# Patient Record
Sex: Male | Born: 1950 | Race: White | Hispanic: No | Marital: Married | Smoking: Former smoker
Health system: Southern US, Community
[De-identification: ages and names within clinical notes are randomized; demographics above are authoritative.]

## PROBLEM LIST (undated history)

## (undated) DIAGNOSIS — F329 Major depressive disorder, single episode, unspecified: Secondary | ICD-10-CM

## (undated) DIAGNOSIS — F1010 Alcohol abuse, uncomplicated: Secondary | ICD-10-CM

## (undated) DIAGNOSIS — N179 Acute kidney failure, unspecified: Secondary | ICD-10-CM

## (undated) DIAGNOSIS — F32A Depression, unspecified: Secondary | ICD-10-CM

## (undated) DIAGNOSIS — E871 Hypo-osmolality and hyponatremia: Secondary | ICD-10-CM

## (undated) DIAGNOSIS — R079 Chest pain, unspecified: Secondary | ICD-10-CM

## (undated) DIAGNOSIS — I10 Essential (primary) hypertension: Secondary | ICD-10-CM

## (undated) DIAGNOSIS — E785 Hyperlipidemia, unspecified: Secondary | ICD-10-CM

## (undated) DIAGNOSIS — R748 Abnormal levels of other serum enzymes: Secondary | ICD-10-CM

## (undated) HISTORY — PX: BACK SURGERY: SHX140

## (undated) HISTORY — DX: Hyperlipidemia, unspecified: E78.5

## (undated) HISTORY — PX: HERNIA REPAIR: SHX51

## (undated) HISTORY — PX: LITHOTRIPSY: SUR834

---

## 1998-12-18 ENCOUNTER — Encounter: Admission: RE | Admit: 1998-12-18 | Discharge: 1998-12-18 | Payer: Self-pay | Admitting: Urology

## 1999-06-02 ENCOUNTER — Ambulatory Visit (HOSPITAL_COMMUNITY): Admission: RE | Admit: 1999-06-02 | Discharge: 1999-06-02 | Payer: Self-pay | Admitting: Urology

## 1999-06-02 ENCOUNTER — Encounter (INDEPENDENT_AMBULATORY_CARE_PROVIDER_SITE_OTHER): Payer: Self-pay

## 2000-02-22 ENCOUNTER — Ambulatory Visit (HOSPITAL_COMMUNITY): Admission: RE | Admit: 2000-02-22 | Discharge: 2000-02-22 | Payer: Self-pay | Admitting: Family Medicine

## 2000-02-22 ENCOUNTER — Encounter: Payer: Self-pay | Admitting: Family Medicine

## 2000-03-26 ENCOUNTER — Emergency Department (HOSPITAL_COMMUNITY): Admission: EM | Admit: 2000-03-26 | Discharge: 2000-03-26 | Payer: Self-pay | Admitting: Emergency Medicine

## 2000-03-26 ENCOUNTER — Encounter: Payer: Self-pay | Admitting: Emergency Medicine

## 2000-04-05 ENCOUNTER — Ambulatory Visit (HOSPITAL_COMMUNITY): Admission: RE | Admit: 2000-04-05 | Discharge: 2000-04-05 | Payer: Self-pay | Admitting: Family Medicine

## 2000-04-05 ENCOUNTER — Encounter: Payer: Self-pay | Admitting: Family Medicine

## 2000-04-07 ENCOUNTER — Encounter: Payer: Self-pay | Admitting: Urology

## 2000-04-07 ENCOUNTER — Ambulatory Visit (HOSPITAL_COMMUNITY): Admission: RE | Admit: 2000-04-07 | Discharge: 2000-04-07 | Payer: Self-pay | Admitting: Urology

## 2000-04-12 ENCOUNTER — Encounter: Payer: Self-pay | Admitting: Urology

## 2000-04-12 ENCOUNTER — Encounter: Admission: RE | Admit: 2000-04-12 | Discharge: 2000-04-12 | Payer: Self-pay | Admitting: Urology

## 2000-04-20 ENCOUNTER — Ambulatory Visit (HOSPITAL_COMMUNITY): Admission: RE | Admit: 2000-04-20 | Discharge: 2000-04-20 | Payer: Self-pay | Admitting: Family Medicine

## 2000-04-20 ENCOUNTER — Encounter: Payer: Self-pay | Admitting: Family Medicine

## 2000-08-19 ENCOUNTER — Ambulatory Visit (HOSPITAL_COMMUNITY): Admission: RE | Admit: 2000-08-19 | Discharge: 2000-08-19 | Payer: Self-pay | Admitting: Surgery

## 2001-07-11 ENCOUNTER — Ambulatory Visit (HOSPITAL_COMMUNITY): Admission: RE | Admit: 2001-07-11 | Discharge: 2001-07-11 | Payer: Self-pay | Admitting: Family Medicine

## 2001-07-11 ENCOUNTER — Encounter: Payer: Self-pay | Admitting: Family Medicine

## 2001-07-27 ENCOUNTER — Inpatient Hospital Stay (HOSPITAL_COMMUNITY): Admission: RE | Admit: 2001-07-27 | Discharge: 2001-07-28 | Payer: Self-pay | Admitting: Neurosurgery

## 2001-07-27 ENCOUNTER — Encounter: Payer: Self-pay | Admitting: Neurosurgery

## 2001-10-01 ENCOUNTER — Encounter: Payer: Self-pay | Admitting: Family Medicine

## 2001-10-01 ENCOUNTER — Ambulatory Visit (HOSPITAL_COMMUNITY): Admission: RE | Admit: 2001-10-01 | Discharge: 2001-10-01 | Payer: Self-pay | Admitting: Family Medicine

## 2002-01-02 ENCOUNTER — Encounter: Payer: Self-pay | Admitting: Family Medicine

## 2002-01-02 ENCOUNTER — Encounter: Admission: RE | Admit: 2002-01-02 | Discharge: 2002-01-02 | Payer: Self-pay | Admitting: Family Medicine

## 2002-03-09 ENCOUNTER — Encounter: Admission: RE | Admit: 2002-03-09 | Discharge: 2002-03-09 | Payer: Self-pay | Admitting: Neurosurgery

## 2002-03-09 ENCOUNTER — Encounter: Payer: Self-pay | Admitting: Neurosurgery

## 2003-07-08 ENCOUNTER — Encounter: Admission: RE | Admit: 2003-07-08 | Discharge: 2003-07-08 | Payer: Self-pay | Admitting: Family Medicine

## 2003-08-02 ENCOUNTER — Encounter: Admission: RE | Admit: 2003-08-02 | Discharge: 2003-08-02 | Payer: Self-pay | Admitting: Family Medicine

## 2004-03-17 ENCOUNTER — Ambulatory Visit (HOSPITAL_COMMUNITY): Admission: RE | Admit: 2004-03-17 | Discharge: 2004-03-17 | Payer: Self-pay | Admitting: Family Medicine

## 2004-10-07 ENCOUNTER — Ambulatory Visit (HOSPITAL_COMMUNITY): Admission: RE | Admit: 2004-10-07 | Discharge: 2004-10-08 | Payer: Self-pay | Admitting: Neurological Surgery

## 2004-11-13 IMAGING — US US SCROTUM
1 series · 13 of 25 positions shown · non-contrast
Comparison: none

***CORRECTED REPORT – ADDITIONAL CHARGE ADDED FOR ARTERIAL/VENOUS FLOW IMAGES***
CLINICAL DATA: Right scrotal pain for three months. 
 SCROTAL ULTRASOUND
 Bilateral internal testicular blood flow is normal with no evidence for torsion.  The bilateral testes are normal in size with the right measuring 3.9 cm long x 2.1 cm AP x 2.8 cm wide and the left measuring 4 cm long x 2.3 cm AP x 2.6 cm wide.  At the right epididymal head are two benign-appearing cysts each measuring up to 4 mm consistent with incidental spermatoceles.  Also an additional cystic projection from the right epididymal head measuring up to 3 mm is consistent with anatomic variant appendix epididymis.  The left epididymal head shows a 5 mm similar benign cyst/spermatocele.   The bilateral epididymi are otherwise sonographically normal without inflammation.  Evidence for small left hydrocele is seen especially inferiorly within benign appearing septation.  The fluid at the right scrotum is upper limits of normal especially superiorly with no significant hydrocele.  A left varicocele is seen with Valsalva maneuver and left vessels measuring up to 4 mm wide. 
 IMPRESSION
 1.  Sonographically normal testes. 
 2.  Bilateral small benign appearing epididymal spermatoceles and visualization of the right appendix epididymis.  
 3.  Small left hydrocele as described. 
 4.  Left varicocele.  
 5.  Otherwise no significant abnormality.
  ***CORRECTED REPORT – ADDITIONAL CHARGE ADDED FOR ARTERIAL/VENOUS FLOW IMAGES***

[Series 1: unknown · 0.07mm/px · 13 of 65 slices shown]
[im 1/65]
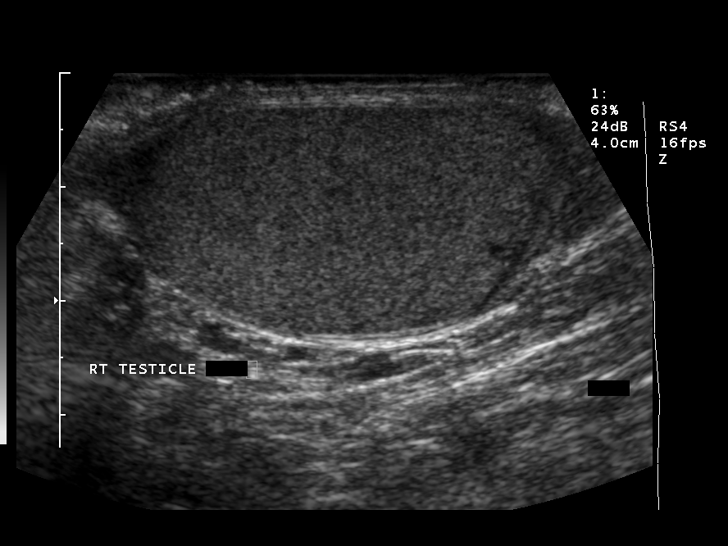
[im 6/65]
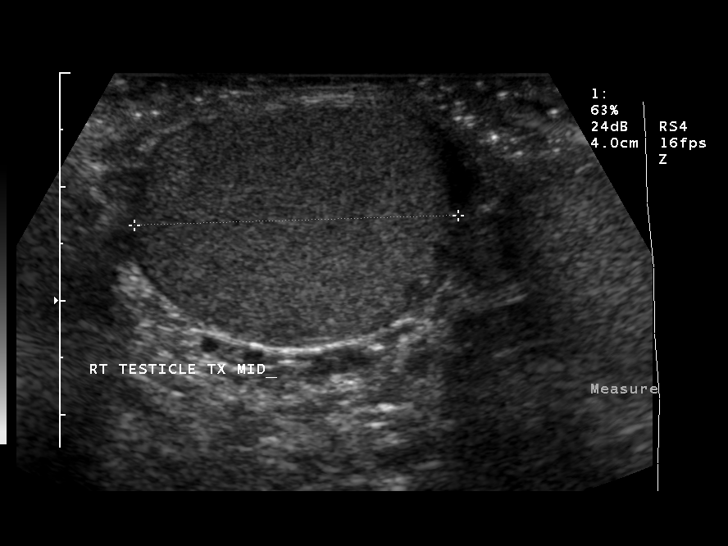
[im 11/65]
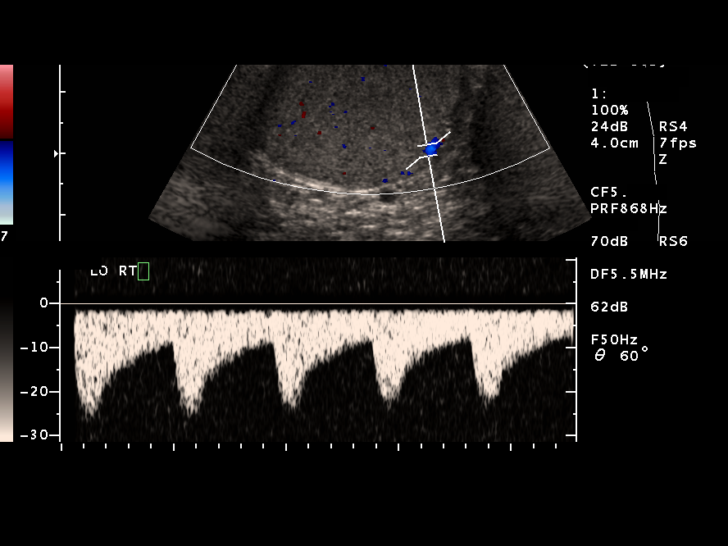
[im 17/65]
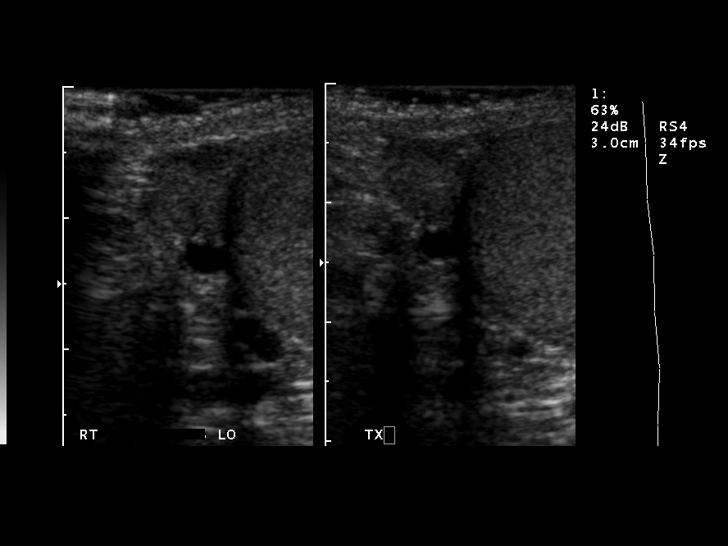
[im 22/65]
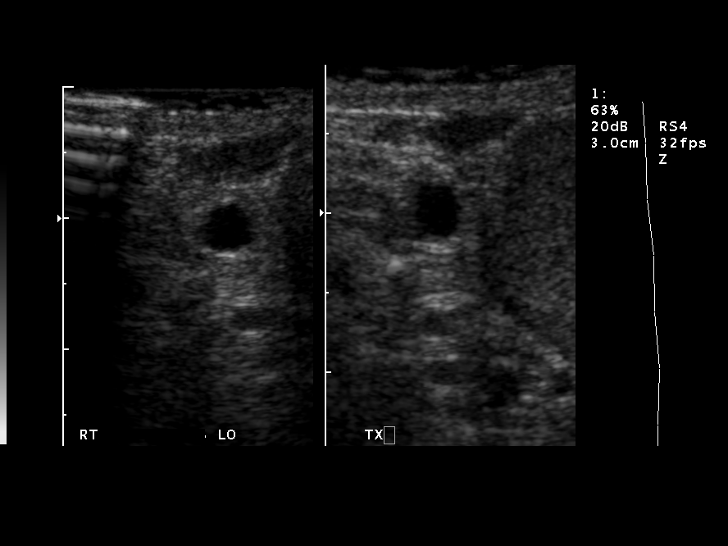
[im 27/65]
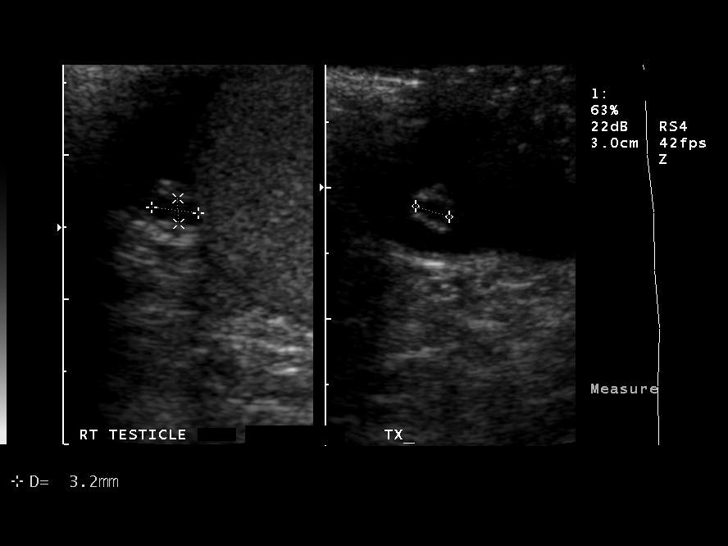
[im 33/65]
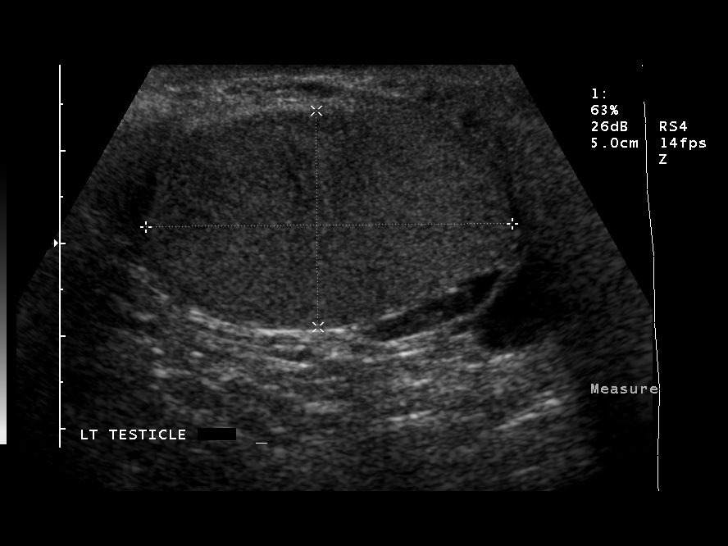
[im 38/65]
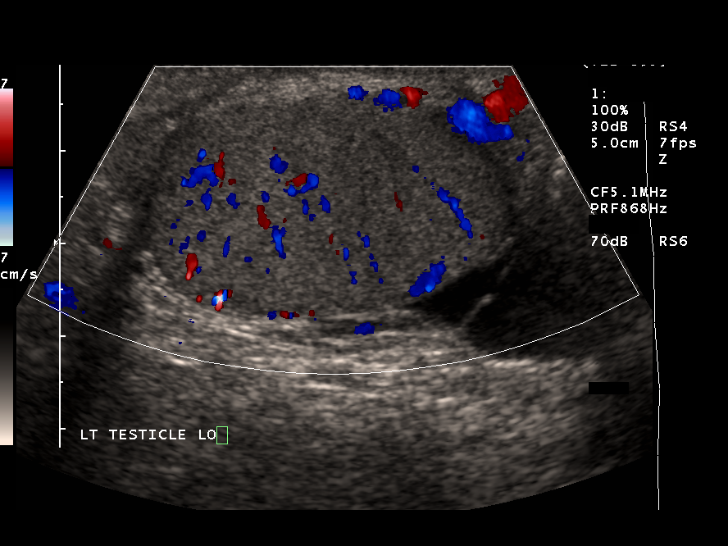
[im 43/65]
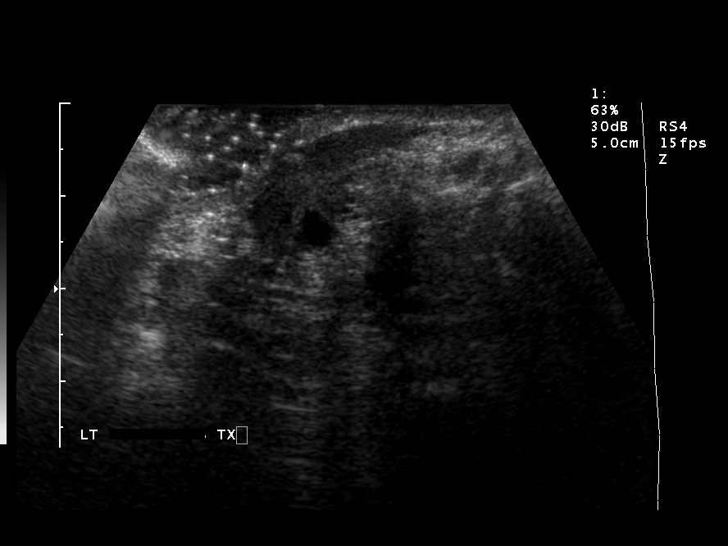
[im 49/65]
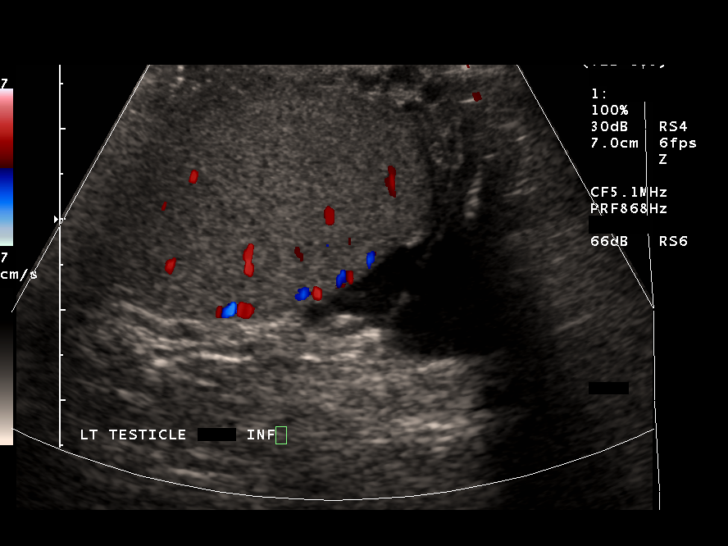
[im 54/65]
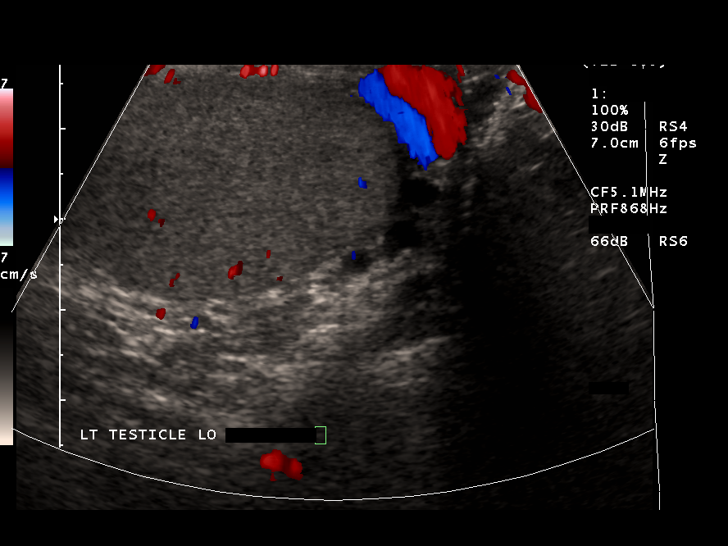
[im 59/65]
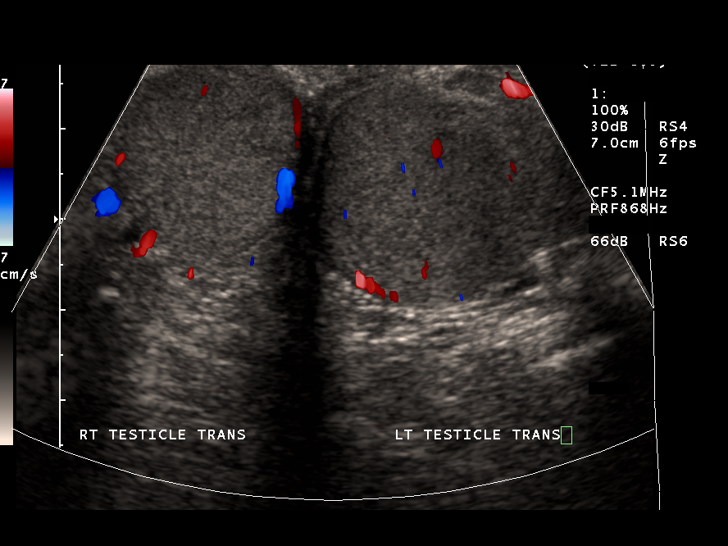
[im 65/65]
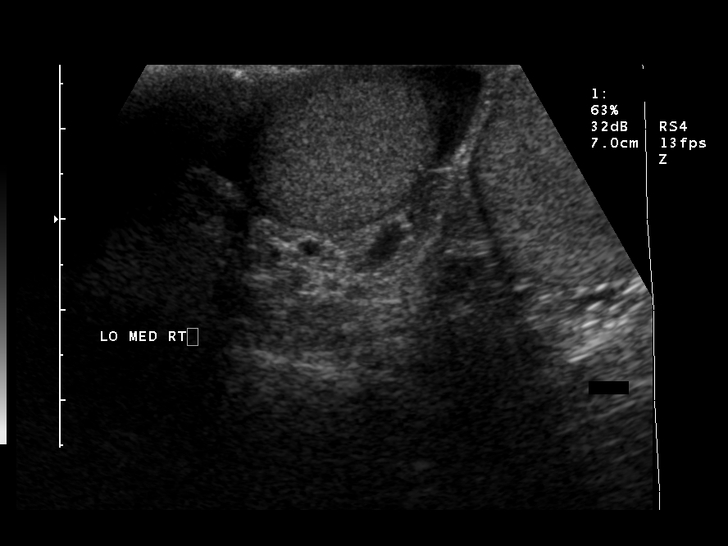

[13 of 25 positions shown; findings below may reference images not displayed]

## 2005-05-10 ENCOUNTER — Encounter: Admission: RE | Admit: 2005-05-10 | Discharge: 2005-05-10 | Payer: Self-pay | Admitting: Neurological Surgery

## 2005-12-17 ENCOUNTER — Encounter: Admission: RE | Admit: 2005-12-17 | Discharge: 2005-12-17 | Payer: Self-pay | Admitting: Neurological Surgery

## 2006-02-23 ENCOUNTER — Inpatient Hospital Stay (HOSPITAL_COMMUNITY): Admission: RE | Admit: 2006-02-23 | Discharge: 2006-02-24 | Payer: Self-pay | Admitting: Neurological Surgery

## 2006-03-28 ENCOUNTER — Encounter: Admission: RE | Admit: 2006-03-28 | Discharge: 2006-03-28 | Payer: Self-pay | Admitting: Neurological Surgery

## 2006-06-28 ENCOUNTER — Encounter: Admission: RE | Admit: 2006-06-28 | Discharge: 2006-06-28 | Payer: Self-pay | Admitting: Neurological Surgery

## 2006-09-26 ENCOUNTER — Encounter: Admission: RE | Admit: 2006-09-26 | Discharge: 2006-09-26 | Payer: Self-pay | Admitting: Neurological Surgery

## 2007-01-31 ENCOUNTER — Encounter: Admission: RE | Admit: 2007-01-31 | Discharge: 2007-01-31 | Payer: Self-pay | Admitting: Neurological Surgery

## 2007-06-06 ENCOUNTER — Emergency Department (HOSPITAL_COMMUNITY): Admission: EM | Admit: 2007-06-06 | Discharge: 2007-06-06 | Payer: Self-pay | Admitting: Emergency Medicine

## 2007-10-10 IMAGING — CR DG LUMBAR SPINE 2-3V
2 series · 2 of 2 positions shown · non-contrast
Comparison: GI [REDACTED] lumbar spine radiographs 03/29/06.

CLINICAL DATA: Persistent low back and hip pain.  Post-PLIF 02/23/06.
 LUMBAR SPINE ? 2 VIEW:

[t l-spine a.p.]
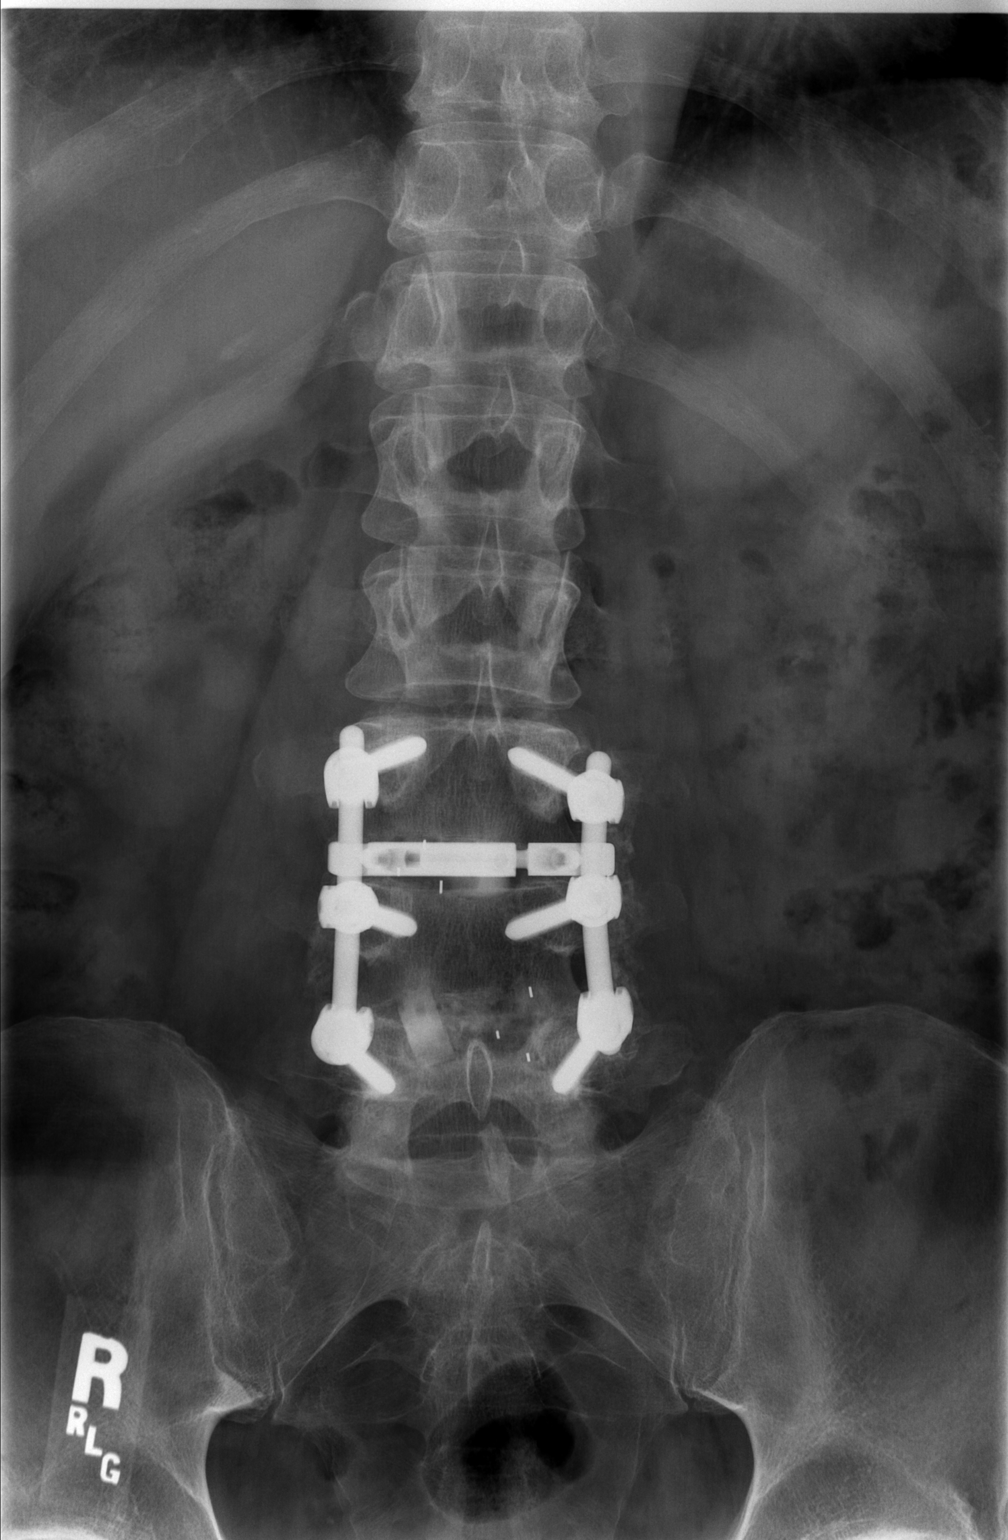

[t l-spine lat]
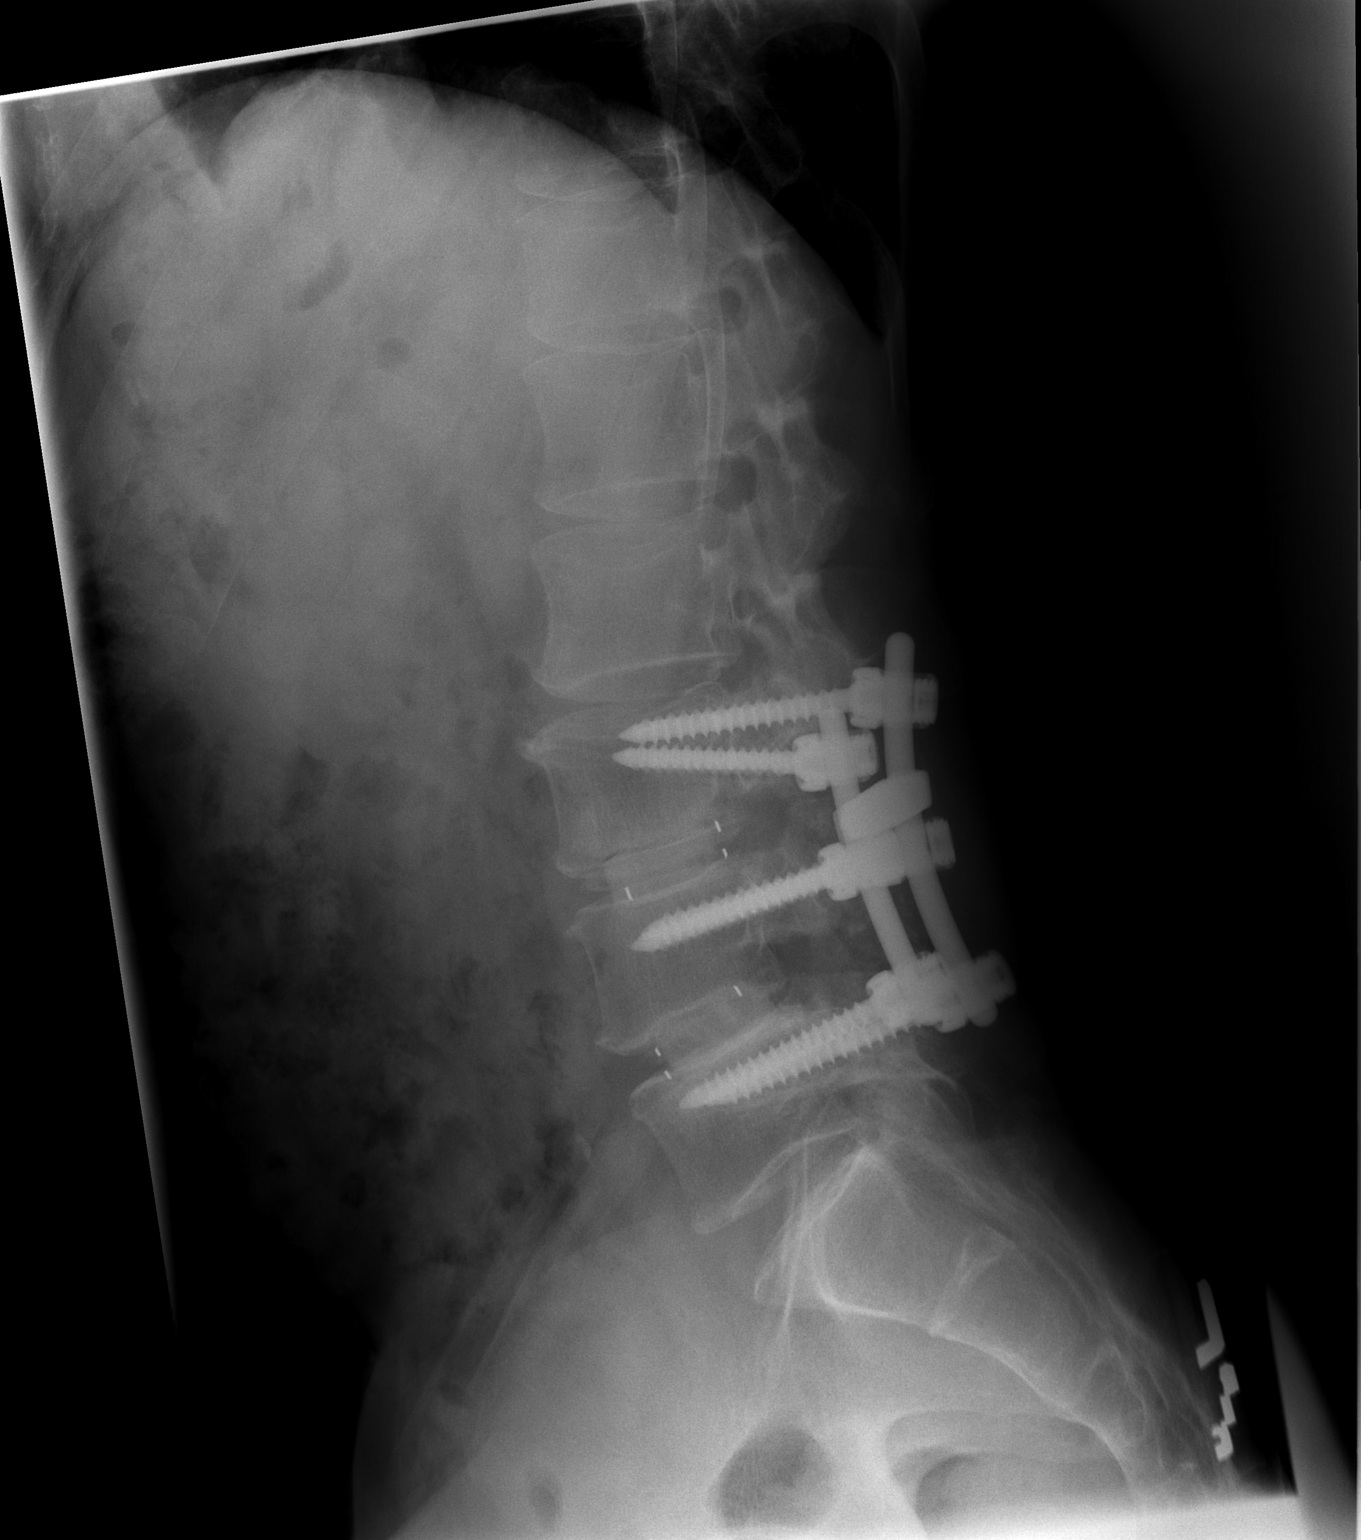

[2 of 2 positions shown; findings below may reference images not displayed]

FINDINGS: Stable position and findings of posterior laminectomy interbody fusion L-3 through L-5 noted.  Transpedicular screws and interbody bone plugs specifically appear stable.  Unchanged slight degenerative disc space narrowing L2-3 with remaining lumbar disc spaces and posterior vertebral alignment normally maintained noted.  No new abnormality is seen.
IMPRESSION: 1.  Stable satisfactory appearing posterior laminectomy interbody fusion L-3 through L-5. 
 2.  Slight degenerative disc disease L2-3.
 3.  No acute findings.

## 2010-03-28 ENCOUNTER — Encounter: Payer: Self-pay | Admitting: Family Medicine

## 2010-03-29 ENCOUNTER — Encounter: Payer: Self-pay | Admitting: Urology

## 2010-03-29 ENCOUNTER — Encounter: Payer: Self-pay | Admitting: Family Medicine

## 2010-03-30 ENCOUNTER — Encounter: Payer: Self-pay | Admitting: Family Medicine

## 2010-07-24 NOTE — Op Note (Signed)
NAME:  Howard Becker, Howard Becker NO.:  [id number]   MEDICAL RECORD NO.:  [id number]          PATIENT TYPE:  INP   LOCATION:  3172                         FACILITY:  [facility]   PHYSICIAN:  Howard Alert, MD     DATE OF BIRTH:  12-Dec-1950   DATE OF PROCEDURE:  02/23/2006  DATE OF DISCHARGE:                               OPERATIVE REPORT   PREOPERATIVE DIAGNOSIS:  Lumbar degenerative disc disease L3-L4 and L4-  L5 with recurrent lumbar disc herniations, L3-L4 and L4-L5, with  mechanical back pain and radicular pain.   POSTOPERATIVE DIAGNOSIS:  Lumbar degenerative disc disease L3-L4 and L4-  L5 with recurrent lumbar disc herniations, L3-L4 and L4-L5, with  mechanical back pain and radicular pain.   PROCEDURE:  1. Lumbar re-exploration with repeat decompression and repeat      discectomy L3-L4 and L4-L5 requiring more work than is usually      required for typical posterior lumbar interbody fusion procedure.  2. Posterior lumbar interbody fusion, L3-L4 and L4-L5, utilizing 10 mm      PEEK interbody cages packed with allograft and local autograft and      10 mm Tangent interbody bone wedges and bone morphogenic protein.  3. Inter-transverse arthrodesis L3 to L5 bilaterally utilizing bone      morphogenic protein soaked sponges packed with local autograft and      allograft cancellous chips.  4. Segmental fixation L3 to L5 bilaterally utilizing the Legacy      pedicle screw system.   SURGEON:  Howard Becker, M.D.   ASSISTANT:  Howard Becker, M.D.   ANESTHESIA:  General endotracheal anesthesia.   COMPLICATIONS:  None apparent.   INDICATIONS FOR PROCEDURE:  Howard Becker is a 60 year old male who had  undergone previous surgery by Howard Becker at L4-L5.  He then had a  recurrent disc at that level with a large disc herniation at L3-L4 above  that.  He underwent a discectomy at L3-L4 and L4-L5 about 1 1/2 years  ago.  He did well from a radiculopathy standpoint, but about six months  after the surgery developed discogenic back pain.  He had a repeat MRI  which showed recurrent disc herniations at both levels though there were  quite small.  He had a diskogram which was concordant at L4-L5.  I  recommended a lumbar re-exploration with posterior lumbar interbody  fusion L3-L4 and L4-L5 with segmental fixation.  He understood the  risks, benefits, and expected outcome and wished to proceed.   DESCRIPTION OF PROCEDURE:  The patient was taken to the operating room  and after induction of adequate generalized endotracheal anesthesia, he  was rolled into the prone position on chest rolls and all pressure  points were padded.  His lumbar region was prepped with DuraPrep and  draped in the usual sterile fashion.  10 mL of local anesthesia was  injected and his old incision was ellipsed out.  We then opened the  fascia and took down the paraspinous musculature in a subperiosteal  fashion to expose the L3-L4 and L4-L5 facet complexes, went out  over  these, and identified the transverse processes of L3, L4, and L5.  We  localized our level with interoperative fluoroscopy and then used the  combination of the Kerrison punch, high speed drill, and Leksell rongeur  to remove the spinous process and perform a complete laminectomy and  hemifacetectomy at L3-L4 and L4-L5. There was significant scar at L4-L5  on the left and at L3-L4 on the right.  We used dental dissectors to  dissect the scar tissue away from the bony edges to complete our bony  decompression and then removal of the yellow ligament from the lateral  recesses. Wide foraminotomies were performed.  The L3, L4, and L5 nerve  roots were identified and decompressed distally into the foramina. Once  the decompression was complete, we coagulated the epidural venous  vasculature.  He was found to have a significant recurrent disc  herniation at L4-L5 on the left side which was removed with a pituitary  rongeur and nerve hook.   We de-tethered the nerve roots at L3-L4 on the  right and L4-L5 on the left.   We then turned our attention to posterior lumbar interbody fusion.  We  started at L4-L5.  We incised the disc space bilaterally and  sequentially distracted the disc space up to 10 mm and then used a 10 mm  rotating cutter and cutting chisel to prepare the endplates at L4-L5  bilaterally and used a 10 mm PEEK interbody cage packed with an Osteo  sponge cancellous allograft and tapped this into position at L4-L5 on  the left side.  We then packed the midline with a combination of  cancellous allograft chips, local autograft and BMP soaked sponge and  then placed a 10 x 26 mm Tangent interbody bone wedge into the  interspace at L4-L5 on the right side.  We then performed our PLIF at L3-  L4 in the same fashion after distracting the disc space up to 10 mm,  using the rotating cutter and cutting chisel.  We placed the PEEK cage  packed with Osteo sponge on the patient's right side and a 10 mm Tangent  interbody bone wedge on the left side.  The midline was packed with a  BMP soaked sponge and local autograft. Once our PLIFs were complete, we  localized the pedicle screw entry zones using surface landmarks and  interoperative fluoroscopy.  We probed each pedicle with a pedicle  probe, tapped each pedicle with a 5/5 tap, and placed 6.5 x 50 mm  pedicle screws into the pedicles of L3, L4 and L5 bilaterally.  We then  decorticated the transverse processes and placed a mixture of autograft  and BMP soaked sponges out over these to perform intertransverse  arthrodesis L3 to L5.  We then placed 80 mm lordotic rods into the  multiaxial screw heads of the pedicle screws and locked these in  position with the locking caps and the anti torque device after  achieving compression on our grafts.   We then irrigated with saline solution containing bacitracin, lined our dura with Gelfoam, placed a crosslink which locked into  position, and  then placed a medium Hemovac drain through a separate stab incision.  We  closed the muscle and fascia with 2-0 Vicryl, closed the subcutaneous  tissue with 2-0 Vicryl, closed the subcuticular tissue with 3-0 Vicryl,  and closed the skin with Benzoin and Steri-Strips.  The drapes removed,  a sterile dressing was applied.  The patient was awakened from  general  anesthesia and transferred to the recovery room in stable condition.  At  the end of the procedure, all sponge, needle and instrument counts were  correct.      Howard Alert, MD  Electronically Signed     DSJ/MEDQ  D:  02/23/2006  T:  02/23/2006  Job:  [id number]

## 2010-07-24 NOTE — Op Note (Signed)
[place]  Patient:    Howard Becker, Howard Becker Visit Number: [id number] MRN: [id number]          Service Type: SUR Location: 3000 [id number] Attending Physician:  Josie Saunders Dictated by:   Danae Orleans Venetia Maxon, M.D. Proc. Date: 07/27/01 Admit Date:  07/27/2001 Discharge Date: 07/28/2001                             Operative Report  PREOPERATIVE DIAGNOSES: 1. Herniated lumbar disc L4-5 with spinal stenosis. 2. Degenerative joint disease. 3. 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  POSTOPERATIVE DIAGNOSES: 1. Herniated lumbar disc L4-5 with spinal stenosis. 2. Degenerative joint disease. 3. 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  PROCEDURE:  Bilateral laminotomies L4-5 with left L4-5 microdiscectomy with microdissection.  SURGEON:  Danae Orleans. Venetia Maxon, M.D.  ASSISTANT:  Cristi Loron, M.D.  ANESTHESIA:  General endotracheal.  ESTIMATED BLOOD LOSS:  Minimal.  COMPLICATIONS:  None.  DISPOSITION/RECOVERY INDICATIONS:  Howard Becker is a 60 year old man with bilateral lower extremity pain and L5 distribution weakness with a central disc herniation at the L4-5 level with significant spondylosis and foraminal stenosis at this level.  He was electively taken to surgery for bilateral laminotomies and a central microdiscectomy at the L4-5 level.  PROCEDURE:  Mr. Mapel was brought to the operating room with uncomplicated induction of general endotracheal anesthesia and placement of intravenous lines.  The patient was placed in the prone position on the Wilson frame.  His low back was prepped and draped in the usual sterile fashion.  Incision was made in the midline overlying the L4-5 interspace and carried through subcutaneous tissue to the lumbar fascia, which was then incised bilaterally. Subperiosteal dissection was performed exposing the L4-5 interspace. Self-retaining retractors were placed.  Intraoperative x-ray confirmed correct orientation.  Using the  [facility] max drill and the equivalent bur, the laminotomy of L4 was performed.  This was carried over to the L5 lamina on the left. Subsequently similar decompression was done on the right.  The common dural tube was identified and the L5 nerve roots were felt to be unencumbered at the neural foraminae bilaterally after decompression.  Subsequently using microdissection technique, the left-side of the thecal sac was mobilized and broke medially exposing the central disc herniation at the L4-5 level.  The annulus was then incised with #15 blade.  Disc material was removed in a piecemeal fashion.  Care was taken to not aggressively instrument the interspace but, at the same time, to remove all loose disc material.  Central disc material was also removed and the interspace was felt to be well decompressed.  The thecal sac was felt to be well decompressed as well. Hemostasis was obtained with bipolar electrocautery and Gelfoam.  The annular rent was cauterized with bipolar electrocautery.  The space was irrigated without evidence of residual disc material.  Subsequently the self-retaining retractor was removed.  Hemostasis was assured.  The wound was copiously irrigated with sterile saline.  The lumbodorsal subfascia was closed with Vicryl sutures.  The subcutaneous tissue was closed with 2-0 Vicryl and 3-0 Vicryl interrupted and inverted to the skin edges.  The wound was dressed with Steri-strips and Telfa gauze and tape.  The patient was extubated in the operating room and taken to the recovery room in satisfactory condition.  He tolerated the procedure well. Dictated by:   Danae Orleans Venetia Maxon, M.D. Attending Physician:  Josie Saunders DD:  07/27/01 TD:  07/30/01 Job:  [id number]

## 2010-07-24 NOTE — Op Note (Signed)
NAME:  Howard Becker, Howard Becker NO.:  [id number]   MEDICAL RECORD NO.:  [id number]          PATIENT TYPE:  OIB   LOCATION:  2899                         FACILITY:  [facility]   PHYSICIAN:  Tia Alert, MD     DATE OF BIRTH:  Oct 18, 1950   DATE OF PROCEDURE:  10/07/2004  DATE OF DISCHARGE:                                 OPERATIVE REPORT   PREOPERATIVE DIAGNOSIS:  Recurrent lumbar disk herniation, L4-5, on the left  with lumbar disk herniation at L3-4 on the right with back and bilateral leg  pain   POSTOPERATIVE DIAGNOSIS:  Recurrent lumbar disk herniation, L4-5, on the  left with lumbar disk herniation at L3-4 on the right with back and  bilateral leg pain.   PROCEDURE:  1.  Redo hemilaminectomy, medial facetectomy and foraminotomy at L4-5 on the      left with repeat diskectomy at L4-5 on the left utilizing microscopic      dissection.  2.  Lumbar hemilaminectomy, medial facetectomy and foraminotomy at L3-4 on      the right with microdiskectomy at L3-4 the right utilizing microscopic      dissection.   SURGEON:  Marikay Alar, M.D.   ASSISTANT:  Donalee Citrin, M.D.   ANESTHESIA:  General endotracheal.   COMPLICATIONS:  None apparent.   INDICATIONS FOR PROCEDURE:  Mr. Deo is a very pleasant 60 year old white  male who had undergone a previous bilateral microdiskectomy by Dr. Kerri Perches approximately 3 years ago.  He had an early recurrence of left leg  pain and had an MRI which showed a recurrent disk herniation at L4-5 on the  left side.  He tried medical management including epidural steroid  injections for quite some time.  He then had a repeat MRI which showed a  large disk herniation at L3-4 on the right and a free fragment at L4-5 on  the left, and was referred for neurosurgical evaluation.  I recommended by  microdiskectomy at L3-4 on the right and redo microdiskectomy at L4-5 on the  left.  He understood the risks, benefits and expected outcome of the  procedure, and wished to proceed.   DESCRIPTION OF PROCEDURE:  The patient was taken to the operating room and  after induction of adequate generalized endotracheal anesthesia, he was  rolled in a prone position on the Wilson frame and all pressure points were  padded.  His lumbar region was prepped with DuraPrep and then draped in the  usual sterile fashion.  Five milliliters of local anesthesia were injected  and then a dorsal midline incision was made and carried down to the  lumbosacral fascia.  The fascia was opened and the paraspinous musculature  was taken down in a subperiosteal fashion to expose L3-4 and L4-5 on the  right side and L4-5 on the left side.  Intraoperative x-ray confirmed my  level and then a repeat hemilaminectomy, medial facetectomy and foraminotomy  were performed at L4-5 on the left side.  There was some significant  scarring at L4-5 on the left which was dissected with a Nicholos Johns  4 dissector  and dental dissectors until a large free fragment was identified lateral to  the dura and removed with a Kerrison punch.  I then widened the  hemilaminotomy and found the lateral edges of the dura and de-tethered this  and found the disk space.  I then went to L3-4 on the right side and used  the high-speed drill and the Kerrison punches to perform a hemilaminectomy,  medial facetectomy and foraminotomy at L3-4 on the right side.  The yellow  ligament was removed to expose the underlying dura and L4 nerve root; this  was retracted medially and a large subannular disk herniation was found.  The epidural venous vasculature was coagulated and cut sharply, the  operating microscope was brought to the field, the annulus was incised and  then a thorough intradiskal diskectomy was performed at L3-4 on the right  side utilizing pituitary rongeurs and curettes.  Once this was done, I used  a nerve hook to palpate into the foramen and into the midline to make sure  adequate  decompression.  I then turned my attention to L4-5 on the left  side.  We de-tethered the nerve root and the lateral edge of the dura from  the scar tissue and again performed a thorough intradiskal diskectomy and  used a nerve hook to feel into the midline and into the foramen to assure  adequate decompression of the neural elements.  I then irrigated with  copious amounts of bacitracin-containing saline solution and inspected the  nerve roots once again, dried all bleeding points with bipolar cautery and  closed the fascia with interrupted #1 Vicryl, closed the subcutaneous and  subcuticular tissues with 2-0 and 3-0 Vicryl and closed skin with Dermabond.  The drapes removed.  The patient was awakened from general anesthesia and  transported to the recovery room in stable condition.  At the end of the  procedure, all sponge, needle and instrument counts were correct.       DSJ/MEDQ  D:  10/07/2004  T:  10/08/2004  Job:  [id number]

## 2010-07-24 NOTE — Op Note (Signed)
[facility]  Patient:    Howard Becker, Howard Becker                       MRN: [id number] Proc. Date: 08/19/00 Adm. Date:  [id number] Attending:  Katha Cabal CC:         Meredith Staggers, M.D.   Operative Report  CCS#:  [id number]  PREOPERATIVE DIAGNOSIS:  Right inguinal hernia.  POSTOPERATIVE DIAGNOSIS:  Right indirect inguinal hernia.  DESCRIPTION OF PROCEDURE:  Mr. Laufer was taken to room #1 on Friday afternoon, August 19, 2000 and given intravenous sedation. The inguinal region was shaved and prepped with Betadine and draped sterilely. I did a regional block with a mixture of Marcaine and lidocaine and made an oblique incision in the right groin. I then noticed that his external oblique was torn and his plate opened and I think this is probably the reason he was having pain. I opened that and spared the ilioinguinal nerve branch. I then mobilized this cord and put a Penrose drain around it. I incised the cremasteric fibers proximally and then dissected free a small proximal sac which I opened and dissected from the surrounding tissue and it was quite thin and friable and it retracted up in the preperitoneal space. However, I went ahead and removed a lipoma of his cord and then allowed all of this to retract up into his abdomen. The vas deferens and the spermatic vessels were all intact. The ring was slightly dilated. I elected to go ahead and repair the entire floor and ring with a piece of Marlex mesh. I cut this to fit. I sutured along the inguinal ligament with a running 2-0 Prolene. I sutured it medially with a running 2-0 Prolene and I tacked it to itself laterally with a horizontal mattress suture. This was then tucked beneath the external oblique. The external oblique was then closed with a running 2-0 Vicryl. A 4-0 Vicryl was used in the subcutaneous tissue and the skin was closed with a running subcuticular 5-0 Vicryl with Benzoin and  Steri-Strips. The patient tolerated the procedure well and was taken to the recovery room in satisfactory condition. DD:  08/19/00 TD:  08/19/00 Job: [id number]

## 2010-11-30 LAB — DIFFERENTIAL
Basophils Absolute: 0
Basophils Relative: 0
Eosinophils Absolute: 0.1
Eosinophils Relative: 1
Lymphocytes Relative: 20

## 2010-11-30 LAB — BASIC METABOLIC PANEL
BUN: 7
CO2: 19
GFR calc Af Amer: >60
GFR calc non Af Amer: >60
Potassium: 3.9
Sodium: 139

## 2010-11-30 LAB — CBC
HCT: 39.3
Hemoglobin: 13.7
MCHC: 34.9
RBC: 4.26
RDW: 13.6

## 2010-11-30 LAB — RAPID URINE DRUG SCREEN, HOSP PERFORMED
Amphetamines: NOT DETECTED
Opiates: POSITIVE — AB

## 2011-01-31 ENCOUNTER — Emergency Department (HOSPITAL_COMMUNITY)
Admission: EM | Admit: 2011-01-31 | Discharge: 2011-01-31 | Disposition: A | Discharge status: Home / Self Care | Payer: Self-pay | Attending: Emergency Medicine | Admitting: Emergency Medicine

## 2011-01-31 DIAGNOSIS — M79609 Pain in unspecified limb: Secondary | ICD-10-CM | POA: Insufficient documentation

## 2011-01-31 DIAGNOSIS — S61012A Laceration without foreign body of left thumb without damage to nail, initial encounter: Secondary | ICD-10-CM

## 2011-01-31 DIAGNOSIS — W268XXA Contact with other sharp object(s), not elsewhere classified, initial encounter: Secondary | ICD-10-CM | POA: Insufficient documentation

## 2011-01-31 DIAGNOSIS — S61209A Unspecified open wound of unspecified finger without damage to nail, initial encounter: Secondary | ICD-10-CM | POA: Insufficient documentation

## 2011-01-31 NOTE — ED Provider Notes (Signed)
History     CSN: [id number] Arrival date & time: 01/31/2011  1:02 PM   First MD Initiated Contact with Patient 01/31/11 1344      Chief Complaint  Patient presents with  . Laceration    (Consider location/radiation/quality/duration/timing/severity/associated sxs/prior treatment) Patient is a 60 y.o. male presenting with skin laceration. The history is provided by the patient.  Laceration  The incident occurred yesterday. The laceration is located on the left hand. The laceration is 1 cm in size. The pain is at a severity of 5/10. The pain is mild. The pain has been constant since onset. His tetanus status is UTD.  Pt cut his finger when a dish broke yesterday. States went to urgent care, his cut was dermabonded. States glue came off today. No other complaints.  History reviewed. No pertinent past medical history.  Past Surgical History  Procedure Date  . Back surgery   . Lithotripsy   . Hernia repair     History reviewed. No pertinent family history.  History  Substance Use Topics  . Smoking status: Former Games developer  . Smokeless tobacco: Not on file  . Alcohol Use: No      Review of Systems  All other systems reviewed and are negative.    Allergies  Review of patient's allergies indicates no known allergies.  Home Medications   Current Outpatient Rx  Name Route Sig Dispense Refill  . MELOXICAM 7.5 MG PO TABS Oral Take 7.5 mg by mouth daily.      Marland Kitchen PREGABALIN 150 MG PO CAPS Oral Take 150 mg by mouth 2 (two) times daily.      . TRAMADOL HCL 50 MG PO TABS Oral Take 50 mg by mouth every 6 (six) hours as needed. Maximum dose= 8 tablets per day for pain       BP 152/84  Pulse 92  Temp(Src) 97.5 F (36.4 C) (Oral)  Resp 22  SpO2 98%  Physical Exam  Nursing note and vitals reviewed. Constitutional: He is oriented to person, place, and time. He appears well-developed. No distress.  Pulmonary/Chest: Effort normal and breath sounds normal. No respiratory distress.   Abdominal: Soft. Bowel sounds are normal. There is no tenderness.  Musculoskeletal: Normal range of motion.  Neurological: He is alert and oriented to person, place, and time.  Skin: Skin is warm and dry.       1cm laceration to the tip pad of the left thumb. Superficial. Non gaping. hemostatic    ED Course  Procedures (including critical care time)  Pt has very small superficial laceration to the tip of the left  Thumb. Wound old, will  Not close with sutures. Sterri strips and dressing applied. Will d/c home.   1. Laceration of left thumb       MDM          Lottie Mussel, PA 01/31/11 1641

## 2011-01-31 NOTE — ED Notes (Signed)
Pt reports cut left thumb yesterday morning doing dishes, went to and urgent care where it was glued closed, did not hold

## 2011-02-01 NOTE — ED Provider Notes (Signed)
Medical screening examination/treatment/procedure(s) were performed by non-physician practitioner and as supervising physician I was immediately available for consultation/collaboration.   Laray Anger, DO 02/01/11 [phone]

## 2011-07-02 ENCOUNTER — Emergency Department (HOSPITAL_COMMUNITY): Payer: Self-pay

## 2011-07-02 ENCOUNTER — Emergency Department (HOSPITAL_COMMUNITY)
Admission: EM | Admit: 2011-07-02 | Discharge: 2011-07-02 | Discharge status: Against Medical Advice | Payer: Self-pay | Attending: Emergency Medicine | Admitting: Emergency Medicine

## 2011-07-02 ENCOUNTER — Encounter (HOSPITAL_COMMUNITY): Payer: Self-pay | Admitting: Emergency Medicine

## 2011-07-02 DIAGNOSIS — Z79899 Other long term (current) drug therapy: Secondary | ICD-10-CM | POA: Insufficient documentation

## 2011-07-02 DIAGNOSIS — R55 Syncope and collapse: Secondary | ICD-10-CM | POA: Insufficient documentation

## 2011-07-02 LAB — COMPREHENSIVE METABOLIC PANEL
ALT: 22 U/L (ref 0–53)
Alkaline Phosphatase: 93 U/L (ref 39–117)
Chloride: 102 mEq/L (ref 96–112)
GFR calc Af Amer: 82 mL/min — ABNORMAL LOW (ref >90)
Glucose, Bld: 86 mg/dL (ref 70–99)
Potassium: 5.3 mEq/L — ABNORMAL HIGH (ref 3.5–5.1)
Sodium: 135 mEq/L (ref 135–145)
Total Bilirubin: 0.4 mg/dL (ref 0.3–1.2)
Total Protein: 7.4 g/dL (ref 6.0–8.3)

## 2011-07-02 LAB — CBC
HCT: 36.1 % — ABNORMAL LOW (ref 39.0–52.0)
Hemoglobin: 11.9 g/dL — ABNORMAL LOW (ref 13.0–17.0)
MCH: 28.7 pg (ref 26.0–34.0)
MCHC: 33 g/dL (ref 30.0–36.0)

## 2011-07-02 LAB — URINALYSIS, ROUTINE W REFLEX MICROSCOPIC
Bilirubin Urine: NEGATIVE
Glucose, UA: NEGATIVE mg/dL
Ketones, ur: NEGATIVE mg/dL
Nitrite: NEGATIVE
pH: 6 (ref 5.0–8.0)

## 2011-07-02 LAB — DIFFERENTIAL
Basophils Relative: 0 % (ref 0–1)
Eosinophils Absolute: 0.4 10*3/uL (ref 0.0–0.7)
Monocytes Absolute: 0.7 10*3/uL (ref 0.1–1.0)
Monocytes Relative: 8 % (ref 3–12)

## 2011-07-02 NOTE — ED Notes (Addendum)
Wife at bedside.  Wife distraught.  Wife states she was in room next to kitchen when she heard pt yell.  She went into room pt was at and found husband on floor with eyes wide open and pupils rolled back.  Wife states that hands may have been shaking slightly. Wife unable to arouse pt, so she called EMs.  Pt regained consciousness after 2 minutes. Wife also states that husband is pain med addict.  States she has to keep percocet in purse, to prevent husband from accessing medication.  Wife also concerned about seizures, although pt has no hx of seizures

## 2011-07-02 NOTE — ED Notes (Signed)
Per EMS. Pt has hx of taking wife's pain meds

## 2011-07-02 NOTE — ED Provider Notes (Signed)
History     CSN: [id number]  Arrival date & time 07/02/11  1020   First MD Initiated Contact with Patient 07/02/11 1021      Chief Complaint  Patient presents with  . Loss of Consciousness  . Fall    (Consider location/radiation/quality/duration/timing/severity/associated sxs/prior treatment) Patient is a 61 y.o. male presenting with syncope. The history is provided by the patient.  Loss of Consciousness This is a new problem. The current episode started today. The problem has been resolved. Pertinent negatives include no abdominal pain, chest pain, chills, congestion, coughing, diaphoresis, fever, headaches, nausea, neck pain, numbness, vertigo or weakness. The symptoms are aggravated by nothing.  Pt reports he was walking through the house, about to go mow the lawn, when he remembers suddenly being on the floor and his wife calling EMS. Pt states he does not remember tripping, does not remember having dizziness, weakness, chest pain, nausea. No current complaints. Denies headache, nausea, vomiting, chest pain, abdominal pain, urinary symptoms.   History reviewed. No pertinent past medical history.  Past Surgical History  Procedure Date  . Back surgery   . Lithotripsy   . Hernia repair     History reviewed. No pertinent family history.  History  Substance Use Topics  . Smoking status: Former Games developer  . Smokeless tobacco: Not on file  . Alcohol Use: No      Review of Systems  Constitutional: Negative for fever, chills and diaphoresis.  HENT: Negative for congestion and neck pain.   Eyes: Negative for visual disturbance.  Respiratory: Negative for cough and chest tightness.   Cardiovascular: Positive for syncope. Negative for chest pain and palpitations.  Gastrointestinal: Negative for nausea and abdominal pain.  Musculoskeletal: Negative.   Skin: Negative.   Neurological: Positive for syncope. Negative for dizziness, vertigo, weakness, numbness and headaches.    Psychiatric/Behavioral: Negative.     Allergies  Review of patient's allergies indicates no known allergies.  Home Medications   Current Outpatient Rx  Name Route Sig Dispense Refill  . ASPIRIN-ACETAMINOPHEN-CAFFEINE 250-250-65 MG PO TABS Oral Take 1 tablet by mouth every 8 (eight) hours as needed. For pain.    Marland Kitchen VITAMIN D3 5000 UNITS PO CAPS Oral Take 4-5 capsules by mouth daily.    Marland Kitchen GINKGO BILOBA PO Oral Take 3 tablets by mouth daily.    . ADULT MULTIVITAMIN W/MINERALS CH Oral Take 2 tablets by mouth daily.    . OXYCODONE-ACETAMINOPHEN 5-325 MG PO TABS Oral Take 1 tablet by mouth every 6 (six) hours as needed. For pain.    Marland Kitchen PREGABALIN 150 MG PO CAPS Oral Take 150 mg by mouth 2 (two) times daily.      . TRAMADOL HCL 50 MG PO TABS Oral Take 50 mg by mouth every 6 (six) hours as needed. Maximum dose= 8 tablets per day for pain       BP 143/81  Pulse 80  Temp(Src) 98.2 F (36.8 C) (Oral)  Resp 14  Ht 6\' 1"  (1.854 m)  Wt 210 lb (95.255 kg)  BMI 27.71 kg/m2  SpO2 98%  Physical Exam  Nursing note and vitals reviewed. Constitutional: He is oriented to person, place, and time. He appears well-developed and well-nourished. No distress.  HENT:  Head: Normocephalic and atraumatic.  Eyes: Conjunctivae and EOM are normal. Pupils are equal, round, and reactive to light.  Neck: Neck supple.  Cardiovascular: Normal rate, regular rhythm and normal heart sounds.   Pulmonary/Chest: Effort normal and breath sounds normal. No respiratory distress.  Abdominal: Soft. Bowel sounds are normal. He exhibits no distension. There is no tenderness.  Musculoskeletal: Normal range of motion. He exhibits no edema.  Neurological: He is alert and oriented to person, place, and time. He has normal reflexes. No cranial nerve deficit. Coordination normal.       5/5 and equal lower and upper extremity strength, normal coordination, finger to nose, no pronator drift  Skin: Skin is warm and dry.  Psychiatric:  He has a normal mood and affect.    ED Course  Procedures (including critical care time)   Date: 07/02/2011  Rate: 75  Rhythm: normal sinus rhythm  QRS Axis: normal  Intervals: normal  ST/T Wave abnormalities: normal  Conduction Disutrbances: none  Narrative Interpretation:   Old EKG Reviewed: No old ECG  HR noram, pt not hypoxyc, oxygen 98% on RA, Not tachypnic, resps are 14, doubt PE. Pt denies Cp and SOB. CT head, lab work, UA obtained. Will monitor. NSR on the monitor, ECG unremarkable.   Results for orders placed during the hospital encounter of 07/02/11  COMPREHENSIVE METABOLIC PANEL      Component Value Range   Sodium 135  135 - 145 (mEq/L)   Potassium 5.3 (*) 3.5 - 5.1 (mEq/L)   Chloride 102  96 - 112 (mEq/L)   CO2 25  19 - 32 (mEq/L)   Glucose, Bld 86  70 - 99 (mg/dL)   BUN 12  6 - 23 (mg/dL)   Creatinine, Ser 1.61  0.50 - 1.35 (mg/dL)   Calcium 9.0  8.4 - 09.6 (mg/dL)   Total Protein 7.4  6.0 - 8.3 (g/dL)   Albumin 3.8  3.5 - 5.2 (g/dL)   AST 21  0 - 37 (U/L)   ALT 22  0 - 53 (U/L)   Alkaline Phosphatase 93  39 - 117 (U/L)   Total Bilirubin 0.4  0.3 - 1.2 (mg/dL)   GFR calc non Af Amer 71 (*) >90 (mL/min)   GFR calc Af Amer 82 (*) >90 (mL/min)  URINALYSIS, ROUTINE W REFLEX MICROSCOPIC      Component Value Range   Color, Urine YELLOW  YELLOW    APPearance CLEAR  CLEAR    Specific Gravity, Urine 1.016  1.005 - 1.030    pH 6.0  5.0 - 8.0    Glucose, UA NEGATIVE  NEGATIVE (mg/dL)   Hgb urine dipstick NEGATIVE  NEGATIVE    Bilirubin Urine NEGATIVE  NEGATIVE    Ketones, ur NEGATIVE  NEGATIVE (mg/dL)   Protein, ur NEGATIVE  NEGATIVE (mg/dL)   Urobilinogen, UA 0.2  0.0 - 1.0 (mg/dL)   Nitrite NEGATIVE  NEGATIVE    Leukocytes, UA NEGATIVE  NEGATIVE   CBC      Component Value Range   WBC 8.3  4.0 - 10.5 (K/uL)   RBC 4.14 (*) 4.22 - 5.81 (MIL/uL)   Hemoglobin 11.9 (*) 13.0 - 17.0 (g/dL)   HCT 04.5 (*) 40.9 - 52.0 (%)   MCV 87.2  78.0 - 100.0 (fL)   MCH 28.7   26.0 - 34.0 (pg)   MCHC 33.0  30.0 - 36.0 (g/dL)   RDW 81.1 (*) 91.4 - 15.5 (%)   Platelets 232  150 - 400 (K/uL)  DIFFERENTIAL      Component Value Range   Neutrophils Relative 68  43 - 77 (%)   Neutro Abs 5.6  1.7 - 7.7 (K/uL)   Lymphocytes Relative 20  12 - 46 (%)   Lymphs Abs 1.6  0.7 - 4.0 (K/uL)   Monocytes Relative 8  3 - 12 (%)   Monocytes Absolute 0.7  0.1 - 1.0 (K/uL)   Eosinophils Relative 5  0 - 5 (%)   Eosinophils Absolute 0.4  0.0 - 0.7 (K/uL)   Basophils Relative 0  0 - 1 (%)   Basophils Absolute 0.0  0.0 - 0.1 (K/uL)  POCT I-STAT TROPONIN I      Component Value Range   Troponin i, poc 0.00  0.00 - 0.08 (ng/mL)   Comment 3            Dg Chest 2 View  07/02/2011  *RADIOLOGY REPORT*  Clinical Data: Loss of consciousness.  History of fall.  CHEST - 2 VIEW  Comparison: Chest x-ray 02/23/2006.  Findings: Lung volumes are normal.  No consolidative airspace disease.  No pleural effusions.  No pneumothorax.  No pulmonary nodule or mass noted.  Pulmonary vasculature and the cardiomediastinal silhouette are within normal limits. No displaced rib fractures.  IMPRESSION: 1. No radiographic evidence of acute cardiopulmonary disease.  Original Report Authenticated By: Florencia Reasons, M.D.   Ct Head Wo Contrast  07/02/2011  *RADIOLOGY REPORT*  Clinical Data: Loss of consciousness.  History of fall.  CT HEAD WITHOUT CONTRAST  Technique:  Contiguous axial images were obtained from the base of the skull through the vertex without contrast.  Comparison: No priors.  Findings: No acute displaced skull fractures are identified.  No acute intracranial abnormalities.  Specifically, no evidence of acute post-traumatic intracranial hemorrhage, no focal mass, mass effect, hydrocephalus or abnormal intra or extra-axial fluid collections. There is a tiny well-defined focus of decreased attenuation in the left frontal lobe white matter (image 14 of series 2), which may represent an old lacunar  infarction.  The visualized paranasal sinuses and mastoids are well pneumatized.  IMPRESSION: 1.  No acute intracranial abnormalities. 2. 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  Original Report Authenticated By: Florencia Reasons, M.D.    12:58 PM Negative work up. Explained to pt, due to his presentation and hx, he needs further work up and testing, as well as monitoring. Advised admission. Pt refusing. Spoke to his wife who at length tried to talk pt into staying, he continues to refuse. Explained to pt that this may happen again, and since we do not have the cause of this, symptoms may worsened and in worst case death is possible. Pt voiced understanding. Pt is AAOx3, in my opinion is capable of making own decisions. Pt will sign out AMA.   No diagnosis found.    MDM          Lottie Mussel, PA 07/02/11 1300

## 2011-07-02 NOTE — ED Notes (Signed)
Pt stated "I will follow up with my doctor."

## 2011-07-02 NOTE — ED Notes (Signed)
Per pt.  Pt had a syncopal episode in kitchen this am.  Pt states he remembers walking in kitchen, then the next thing he knew he was on the floor.  Pt states he has not had a syncopal episode before.  No PMH except prior back surgeries.  No pain, pt walked into room from EMS.  Pt is NSR with S1S2.  No neuro deficit noted

## 2011-07-02 NOTE — ED Notes (Signed)
UKG:[id number]<HC> Expected date:07/02/11<BR> Expected time:10:04 AM<BR> Means of arrival:Ambulance<BR> Comments:<BR> 60yo/fall/weakness

## 2011-07-02 NOTE — ED Provider Notes (Signed)
Medical screening examination/treatment/procedure(s) were conducted as a shared visit with non-physician practitioner(s) and myself.  I personally evaluated the patient during the encounter  A syncopal event with no prodromal symptoms. His examination is unremarkable at this time. We spoke with him at length that this is concerning in that he will requires admission and full evaluation. An evaluation was completed in the emergency department which was unremarkable. He once again explained the importance of him staying in that leaving against medical device was with significant risks including death. The patient understands and will leave against medical device  Dayton Bailiff, MD 07/02/11 1318

## 2011-07-02 NOTE — ED Notes (Signed)
Pt electronically signed elopement form. Witnessed by Crissie Figures RN and Antoine Poche.

## 2011-08-19 ENCOUNTER — Emergency Department (HOSPITAL_COMMUNITY)
Admission: EM | Admit: 2011-08-19 | Discharge: 2011-08-19 | Disposition: A | Discharge status: Home / Self Care | Payer: Self-pay | Attending: Emergency Medicine | Admitting: Emergency Medicine

## 2011-08-19 ENCOUNTER — Encounter (HOSPITAL_COMMUNITY): Payer: Self-pay | Admitting: *Deleted

## 2011-08-19 ENCOUNTER — Emergency Department (HOSPITAL_COMMUNITY): Payer: Self-pay

## 2011-08-19 DIAGNOSIS — R569 Unspecified convulsions: Secondary | ICD-10-CM | POA: Insufficient documentation

## 2011-08-19 HISTORY — DX: Major depressive disorder, single episode, unspecified: F32.9

## 2011-08-19 HISTORY — DX: Depression, unspecified: F32.A

## 2011-08-19 LAB — CBC
MCH: 28.9 pg (ref 26.0–34.0)
MCV: 88.9 fL (ref 78.0–100.0)
Platelets: 255 10*3/uL (ref 150–400)
RDW: 16 % — ABNORMAL HIGH (ref 11.5–15.5)

## 2011-08-19 LAB — URINALYSIS, ROUTINE W REFLEX MICROSCOPIC
Bilirubin Urine: NEGATIVE
Ketones, ur: NEGATIVE mg/dL
Nitrite: NEGATIVE
pH: 5 (ref 5.0–8.0)

## 2011-08-19 LAB — BASIC METABOLIC PANEL
CO2: 27 mEq/L (ref 19–32)
Calcium: 8.3 mg/dL — ABNORMAL LOW (ref 8.4–10.5)
Creatinine, Ser: 1.02 mg/dL (ref 0.50–1.35)
Glucose, Bld: 82 mg/dL (ref 70–99)

## 2011-08-19 MED ORDER — SODIUM CHLORIDE 0.9 % IV BOLUS (SEPSIS)
1000.0000 mL | Freq: Once | INTRAVENOUS | Status: AC
  Administered 2011-08-19: 1000 mL via INTRAVENOUS

## 2011-08-19 MED ORDER — SODIUM CHLORIDE 0.9 % IV SOLN
1000.0000 mg | Freq: Once | INTRAVENOUS | Status: AC
  Administered 2011-08-19: 1000 mg via INTRAVENOUS
  Filled 2011-08-19: qty 10

## 2011-08-19 MED ORDER — LORAZEPAM 2 MG/ML IJ SOLN: 1.0000 mg | Freq: Once | INTRAMUSCULAR | Status: DC

## 2011-08-19 MED ORDER — LEVETIRACETAM 500 MG PO TABS: 500.0000 mg | ORAL_TABLET | Freq: Two times a day (BID) | ORAL | Status: DC

## 2011-08-19 MED ORDER — LORAZEPAM 2 MG/ML IJ SOLN
1.0000 mg | Freq: Once | INTRAMUSCULAR | Status: AC
  Administered 2011-08-19: 1 mg via INTRAVENOUS
  Filled 2011-08-19: qty 1

## 2011-08-19 NOTE — ED Notes (Signed)
Patient transported to MRI 

## 2011-08-19 NOTE — ED Notes (Signed)
Per EMS pts wife saw pt in computer chair shaking, lasting about 5 minutes, no oral trauma, when ems arrived was alert/oriented, had seizure one month ago, left hospital ama that time.

## 2011-08-19 NOTE — Discharge Instructions (Signed)
  Driving and Equipment Restrictions Some medical problems make it dangerous to drive, ride a bike, or use machines. Some of these problems are:  A hard blow to the head (concussion).   Passing out (fainting).   Twitching and shaking (seizures).   Low blood sugar.   Taking medicine to help you relax (sedatives).   Taking pain medicines.   Wearing an eye patch.   Wearing splints. This can make it hard to use parts of your body that you need to drive safely.  HOME CARE   Do not drive until your doctor says it is okay.   Do not use machines until your doctor says it is okay.  You may need a form signed by your doctor (medical release) before you can drive again. You may also need this form before you do other tasks where you need to be fully alert. MAKE SURE YOU:  Understand these instructions.   Will watch your condition.   Will get help right away if you are not doing well or get worse.  Document Released: 04/01/2004 Document Revised: 02/11/2011 Document Reviewed: 07/02/2009 [facility] Patient Information 2012 [place], [place].Seizure, Adult A seizure happens when there is uncontrolled activity in the brain. Most seizures cause either the whole body to shake or just one part of the body to shake. The shaking is called a convulsion. Other seizures cause the body or a part of the body to tense up.  HOME CARE   Keep all follow-up visits.   Take medicines as told by your doctor.   Do not swim until your doctor says it is okay.   Do not drive until your doctor says it is okay.   Make sure your friends, loved ones, and coworkers know to do the following things if you have a seizure:   Lower you gently to the ground.   Keep the area around you free of things that might fall or things that you might grab.   Avoid putting anything in or near your mouth.   Call your local emergency services (911 in [place]  GET HELP RIGHT AWAY IF:   You have confusion or feel dazed.   You  feel sleepy.  MAKE SURE YOU:   Understand these instructions.   Will watch your condition.   Will get help right away if you are not doing well or get worse.  Document Released: 08/11/2007 Document Revised: 02/11/2011 Document Reviewed: 02/10/2011 [facility] Patient Information 2012 [place], [place].

## 2011-08-19 NOTE — ED Notes (Signed)
Pt. Is not able to use the restroom at this time, but urinal is at bedside.

## 2011-08-19 NOTE — ED Provider Notes (Signed)
Medical screening examination/treatment/procedure(s) were conducted as a shared visit with non-physician practitioner(s) and myself.  I personally evaluated the patient during the encounter  Discussed with Dr Roseanne Reno. Suggests MRI brain while in ED. Load with Keppra 1000mg  and start 500 mg BID. No driving. F/u as outpt if no acute findings  Loren Racer, MD 08/19/11 1605

## 2011-08-19 NOTE — ED Notes (Signed)
Pt back from MRI, sleeping

## 2011-08-19 NOTE — ED Notes (Signed)
WUJ:[id number]<JY> Expected date:<BR> Expected time: 9:32 AM<BR> Means of arrival:Ambulance<BR> Comments:<BR> 61yoM seizure, Hx of one previously but never diagnosed.

## 2011-08-19 NOTE — ED Provider Notes (Signed)
History     CSN: [id number]  Arrival date & time 08/19/11  0945   First MD Initiated Contact with Patient 08/19/11 1007      Chief Complaint  Patient presents with  . Seizures    (Consider location/radiation/quality/duration/timing/severity/associated sxs/prior treatment) Patient is a 61 y.o. male presenting with seizures.  Seizures  This is a recurrent problem. The current episode started 1 to 2 hours ago. There was 1 seizure. The most recent episode lasted 30 to 120 seconds. Pertinent negatives include patient does not experience confusion, no headaches, no speech difficulty, no visual disturbance, no chest pain, no cough, no nausea and no vomiting. Characteristics include eye deviation and rhythmic jerking. Characteristics do not include bowel incontinence, bladder incontinence, loss of consciousness, bit tongue or cyanosis. The seizure(s) had upper extremity focality.   61 y/o male INAD accompanied by wife c/o seizure like activity for 30s at 9 am today. Pt was sitting at chair at computer and wife heard a yell, and say that his arms are jerking and eyes rolled back. Pt post-ictal for roughly 10 minutes. Denies head trauma (Pt remained in chair), preceding aura, bowel or bladder incontinence. Pt has no complainants now other than being thirsty. Pt has 1st seizure episode in April 2013 and Had negative CT at that time. Pt's PCP at [facility] is aware, but Pt cannot afford the cardiac and neuro work up. Denies chest pain, abdominal pain, HA. Pt recently started Wellbutrin and has been having sleeping less than normal lately. Wife reports episodes of low BP at doctors visits.   Past Medical History  Diagnosis Date  . Depression     Past Surgical History  Procedure Date  . Back surgery   . Lithotripsy   . Hernia repair     History reviewed. No pertinent family history.  History  Substance Use Topics  . Smoking status: Former Games developer  . Smokeless tobacco: Never Used  . Alcohol Use: No       Review of Systems  Unable to perform ROS Constitutional: Negative for fever, chills, activity change, fatigue and unexpected weight change.  HENT: Negative for trouble swallowing and neck pain.   Eyes: Negative for visual disturbance.  Respiratory: Negative for cough and shortness of breath.   Cardiovascular: Negative for chest pain, palpitations, leg swelling and cyanosis.  Gastrointestinal: Negative for nausea, vomiting, abdominal pain, constipation and bowel incontinence.  Genitourinary: Negative for bladder incontinence and dysuria.  Skin: Negative for rash.  Neurological: Positive for seizures. Negative for dizziness, tremors, loss of consciousness, facial asymmetry, speech difficulty, weakness, light-headedness, numbness and headaches.  Psychiatric/Behavioral: Negative for confusion.    Allergies  Review of patient's allergies indicates no known allergies.  Home Medications   Current Outpatient Rx  Name Route Sig Dispense Refill  . ASPIRIN-ACETAMINOPHEN-CAFFEINE 250-250-65 MG PO TABS Oral Take 1 tablet by mouth every 8 (eight) hours as needed. For pain.    Marland Kitchen BUPROPION HCL ER (XL) 150 MG PO TB24 Oral Take 150 mg by mouth daily.    Marland Kitchen VITAMIN D3 5000 UNITS PO CAPS Oral Take 4-5 capsules by mouth daily.    Marland Kitchen CITALOPRAM HYDROBROMIDE 20 MG PO TABS Oral Take 20 mg by mouth daily.    Marland Kitchen GINKGO BILOBA PO Oral Take 3 tablets by mouth daily.    . ADULT MULTIVITAMIN W/MINERALS CH Oral Take 2 tablets by mouth daily.    Marland Kitchen PREGABALIN 150 MG PO CAPS Oral Take 150 mg by mouth 2 (two) times daily.      Marland Kitchen  TRAMADOL HCL 50 MG PO TABS Oral Take 50 mg by mouth every 6 (six) hours as needed. Maximum dose= 8 tablets per day for pain     . OXYCODONE-ACETAMINOPHEN 5-325 MG PO TABS Oral Take 1 tablet by mouth every 6 (six) hours as needed. For pain.      BP 106/65  Pulse 80  Temp 98.6 F (37 C) (Oral)  Resp 19  SpO2 100%  Physical Exam  Vitals reviewed. Constitutional: He is oriented to  person, place, and time. He appears well-developed and well-nourished. No distress.  HENT:  Head: Normocephalic and atraumatic.  Right Ear: External ear normal.  Left Ear: External ear normal.  Eyes: Conjunctivae and EOM are normal.  Neck: Normal range of motion.  Cardiovascular: Normal rate, regular rhythm and normal heart sounds.   Pulmonary/Chest: Effort normal and breath sounds normal. No respiratory distress. He has no wheezes. He has no rales. He exhibits no tenderness.  Abdominal: Soft. Bowel sounds are normal. He exhibits no distension. There is no tenderness.  Musculoskeletal: Normal range of motion.  Neurological: He is alert and oriented to person, place, and time.       CN 3-12 intact with negative Rhomberg  Skin: Skin is warm and dry.  Psychiatric: He has a normal mood and affect. His behavior is normal. Thought content normal.    ED Course  Procedures (including critical care time)  Labs Reviewed - No data to display No results found.   No diagnosis found.  EKG: normal EKG, normal sinus rhythm, unchanged from previous tracings.  MDM  61 y/o male INAD  C/o seizure like activity lasting 30 at 70 and described a a yell followed by bilateral upper extremity movement and eyes rolling back with post-ictal state for 10 minutes. Denies CP, HA,  Abdominal pain. Neurology consult requests MRI with and without contrast shows no acute pathology. Pt given loading dose of keppra and d/c'd on 500mg  BID with neuro f/o with Dr. Roseanne Reno ant strict no driving  Instructions. Pt verbalized understanding.        Wynetta Emery, PA-C 08/19/11 1448

## 2011-08-19 NOTE — ED Notes (Signed)
Pt verbalizes understanding.  Pt wife at bedside and driver

## 2011-08-19 NOTE — ED Notes (Signed)
Wife states couple months ago pt had a seizure, came to hospital and they wanted to admit him but he left AMA d/t not having insurance, states they suggested for him to go see a neurologist and cardiologist but he never did.

## 2011-08-20 MED ORDER — GADOBENATE DIMEGLUMINE 529 MG/ML IV SOLN
20.0000 mL | Freq: Once | INTRAVENOUS | Status: AC | PRN
  Administered 2011-08-20: 20 mL via INTRAVENOUS

## 2012-03-08 ENCOUNTER — Emergency Department (INDEPENDENT_AMBULATORY_CARE_PROVIDER_SITE_OTHER)
Admission: EM | Admit: 2012-03-08 | Discharge: 2012-03-08 | Disposition: A | Discharge status: Home / Self Care | Payer: Self-pay | Source: Home / Self Care

## 2012-03-08 ENCOUNTER — Encounter (HOSPITAL_COMMUNITY): Payer: Self-pay | Admitting: Emergency Medicine

## 2012-03-08 ENCOUNTER — Emergency Department (INDEPENDENT_AMBULATORY_CARE_PROVIDER_SITE_OTHER): Payer: Self-pay

## 2012-03-08 DIAGNOSIS — S2239XA Fracture of one rib, unspecified side, initial encounter for closed fracture: Secondary | ICD-10-CM

## 2012-03-08 MED ORDER — HYDROCODONE-ACETAMINOPHEN 5-325 MG PO TABS
2.0000 | ORAL_TABLET | Freq: Once | ORAL | Status: AC
  Administered 2012-03-08: 2 via ORAL

## 2012-03-08 MED ORDER — KETOROLAC TROMETHAMINE 30 MG/ML IJ SOLN
INTRAMUSCULAR | Status: AC
  Filled 2012-03-08: qty 1

## 2012-03-08 MED ORDER — HYDROCODONE-ACETAMINOPHEN 5-325 MG PO TABS: ORAL_TABLET | ORAL | Status: DC

## 2012-03-08 MED ORDER — KETOROLAC TROMETHAMINE 30 MG/ML IJ SOLN
30.0000 mg | Freq: Once | INTRAMUSCULAR | Status: AC
  Administered 2012-03-08: 30 mg via INTRAMUSCULAR

## 2012-03-08 MED ORDER — HYDROCODONE-ACETAMINOPHEN 5-325 MG PO TABS
ORAL_TABLET | ORAL | Status: AC
  Filled 2012-03-08: qty 2

## 2012-03-08 NOTE — ED Notes (Signed)
Pt is here c/o pain on right side of rib cage... States he fell yest around 10:00 in the shower hitting the edge of the bathtub... Denies: head inj/LOC... Pain increases w/activity and it's constant... He is alert w/pain discomfort.

## 2012-03-08 NOTE — ED Provider Notes (Signed)
History     CSN: [id number]  Arrival date & time 03/08/12  1251   None     Chief Complaint  Patient presents with  . Rib Injury    (Consider location/radiation/quality/duration/timing/severity/associated sxs/prior treatment) HPI Comments: 62 year old male who fell in the bathroom yesterday around 10 AM and struck his right lower lateral chest on the edge of the bathtub. He experienced severe acute pain at that time. The pain is located in the right lateral chest along the mid and posterior axillary line and the lower ribs. He denies associated cough, shortness of breath or fever. He does have pain with cough and deep breath and certain movements. He denies injury to the head neck chest back abdomen hips or extremities. He is fully awake and alert and has had no problems with alertness, wakefulness or cognition. His wife is present with him.   Past Medical History  Diagnosis Date  . Depression     Past Surgical History  Procedure Date  . Back surgery   . Lithotripsy   . Hernia repair     No family history on file.  History  Substance Use Topics  . Smoking status: Former Games developer  . Smokeless tobacco: Never Used  . Alcohol Use: No      Review of Systems  Constitutional: Negative.   Respiratory: Negative.   Gastrointestinal: Negative.   Genitourinary: Negative.   Musculoskeletal:       As per HPI  Skin: Negative.   Neurological: Negative for dizziness, weakness, numbness and headaches.    Allergies  Review of patient's allergies indicates no known allergies.  Home Medications   Current Outpatient Rx  Name  Route  Sig  Dispense  Refill  . ALPRAZOLAM PO   Oral   Take by mouth.         . ASPIRIN-ACETAMINOPHEN-CAFFEINE 250-250-65 MG PO TABS   Oral   Take 1 tablet by mouth every 8 (eight) hours as needed. For pain.         Marland Kitchen PREGABALIN 150 MG PO CAPS   Oral   Take 150 mg by mouth 2 (two) times daily.           . TRAMADOL HCL 50 MG PO TABS   Oral  Take 50 mg by mouth every 6 (six) hours as needed. Maximum dose= 8 tablets per day for pain          . BUPROPION HCL ER (XL) 150 MG PO TB24   Oral   Take 150 mg by mouth daily.         Marland Kitchen VITAMIN D3 5000 UNITS PO CAPS   Oral   Take 4-5 capsules by mouth daily.         Marland Kitchen CITALOPRAM HYDROBROMIDE 20 MG PO TABS   Oral   Take 20 mg by mouth daily.         Marland Kitchen GINKGO BILOBA PO   Oral   Take 3 tablets by mouth daily.         Marland Kitchen HYDROCODONE-ACETAMINOPHEN 5-325 MG PO TABS      1 or 2 tablets every 4 hours as needed for pain   24 tablet   0   . LEVETIRACETAM 500 MG PO TABS   Oral   Take 1 tablet (500 mg total) by mouth every 12 (twelve) hours.   30 tablet   0   . ADULT MULTIVITAMIN W/MINERALS CH   Oral   Take 2 tablets by mouth daily.         Marland Kitchen  OXYCODONE-ACETAMINOPHEN 5-325 MG PO TABS   Oral   Take 1 tablet by mouth every 6 (six) hours as needed. For pain.           BP 141/84  Pulse 90  Temp 97.7 F (36.5 C) (Oral)  Resp 20  SpO2 99%  Physical Exam  Nursing note and vitals reviewed. Constitutional: He is oriented to person, place, and time. He appears well-developed and well-nourished.  HENT:  Head: Normocephalic and atraumatic.  Eyes: EOM are normal. Left eye exhibits no discharge.  Neck: Normal range of motion. Neck supple.  Cardiovascular: Normal rate, regular rhythm and normal heart sounds.   Pulmonary/Chest: Effort normal and breath sounds normal. No respiratory distress. He has no wheezes. He has no rales. He exhibits tenderness.       See description of area in history of present illness  Abdominal: Soft. He exhibits no distension. There is no tenderness. There is no rebound and no guarding.  Musculoskeletal: Normal range of motion. He exhibits no edema.  Neurological: He is alert and oriented to person, place, and time. No cranial nerve deficit.  Skin: Skin is warm and dry.  Psychiatric: He has a normal mood and affect.    ED Course  Procedures  (including critical care time)  Labs Reviewed - No data to display Dg Ribs Unilateral W/chest Right  03/08/2012  *RADIOLOGY REPORT*  Clinical Data: Right rib pain secondary to a fall.  RIGHT RIBS AND CHEST - 3+ VIEW  Comparison: Radiographs dated 07/02/2011  Findings: There is a slightly displaced acute fracture of the lateral aspect of the right eighth rib. There is an old healed fracture of the anterior lateral aspect of the right sixth rib.  No underlying pneumothorax or lung contusion or pleural effusion. The heart size and vascularity are normal.  Lungs are clear.  IMPRESSION: Acute fracture of the lateral aspect of the right eighth rib with slight displacement.   Original Report Authenticated By: Francene Boyers, M.D.      1. Closed rib fracture       MDM  Norco 5 mg 1-2 every 4 hours when necessary pain #24 Follow with your doctor in a couple weeks as necessary. Expect had pain for the next 3-5 weeks. If he developed shortness of breath, fever, cough or other symptoms associated with this rib fracture seek medical attention immediately. The patient is discharged in stable condition. He has no shortness of breath or cough. He is accompanied by his wife who is driving. His blood pressure has responded well to rest and analgesics. He has no history of hypertension but should have it checked by his physician on his next visit. It is noted that he is not taking Percocet or any other narcotic at this time.         Hayden Rasmussen, NP 03/08/12 1504  Hayden Rasmussen, NP 03/08/12 [phone]

## 2012-03-09 NOTE — ED Provider Notes (Signed)
Medical screening examination/treatment/procedure(s) were performed by resident physician or non-physician practitioner and as supervising physician I was immediately available for consultation/collaboration.   Ayeza Therriault DOUGLAS MD.    Loral Campi D Chrissie Dacquisto, MD 03/09/12 2036 

## 2012-03-26 ENCOUNTER — Emergency Department (HOSPITAL_COMMUNITY): Payer: BC Managed Care – PPO

## 2012-03-26 ENCOUNTER — Emergency Department (HOSPITAL_COMMUNITY)
Admission: EM | Admit: 2012-03-26 | Discharge: 2012-03-26 | Disposition: A | Discharge status: Home / Self Care | Payer: BC Managed Care – PPO | Attending: Emergency Medicine | Admitting: Emergency Medicine

## 2012-03-26 DIAGNOSIS — F3289 Other specified depressive episodes: Secondary | ICD-10-CM | POA: Insufficient documentation

## 2012-03-26 DIAGNOSIS — Z79899 Other long term (current) drug therapy: Secondary | ICD-10-CM | POA: Insufficient documentation

## 2012-03-26 DIAGNOSIS — R569 Unspecified convulsions: Secondary | ICD-10-CM

## 2012-03-26 DIAGNOSIS — R0781 Pleurodynia: Secondary | ICD-10-CM

## 2012-03-26 DIAGNOSIS — R404 Transient alteration of awareness: Secondary | ICD-10-CM | POA: Insufficient documentation

## 2012-03-26 DIAGNOSIS — Z87891 Personal history of nicotine dependence: Secondary | ICD-10-CM | POA: Insufficient documentation

## 2012-03-26 DIAGNOSIS — F329 Major depressive disorder, single episode, unspecified: Secondary | ICD-10-CM | POA: Insufficient documentation

## 2012-03-26 DIAGNOSIS — R059 Cough, unspecified: Secondary | ICD-10-CM | POA: Insufficient documentation

## 2012-03-26 DIAGNOSIS — R0789 Other chest pain: Secondary | ICD-10-CM | POA: Insufficient documentation

## 2012-03-26 DIAGNOSIS — R05 Cough: Secondary | ICD-10-CM | POA: Insufficient documentation

## 2012-03-26 LAB — CBC WITH DIFFERENTIAL/PLATELET
Basophils Absolute: 0 10*3/uL (ref 0.0–0.1)
Basophils Relative: 1 % (ref 0–1)
Eosinophils Absolute: 0.4 10*3/uL (ref 0.0–0.7)
MCH: 30.1 pg (ref 26.0–34.0)
MCHC: 34.1 g/dL (ref 30.0–36.0)
Neutrophils Relative %: 58 % (ref 43–77)
Platelets: 240 10*3/uL (ref 150–400)
RBC: 4.08 MIL/uL — ABNORMAL LOW (ref 4.22–5.81)
RDW: 15.1 % (ref 11.5–15.5)

## 2012-03-26 LAB — BASIC METABOLIC PANEL
GFR calc Af Amer: >90 mL/min (ref >90)
GFR calc non Af Amer: 87 mL/min — ABNORMAL LOW (ref >90)
Potassium: 3.8 mEq/L (ref 3.5–5.1)
Sodium: 134 mEq/L — ABNORMAL LOW (ref 135–145)

## 2012-03-26 MED ORDER — HYDROCODONE-ACETAMINOPHEN 5-325 MG PO TABS: 1.0000 | ORAL_TABLET | ORAL | Status: DC | PRN

## 2012-03-26 MED ORDER — OXYCODONE-ACETAMINOPHEN 5-325 MG PO TABS: 1.0000 | ORAL_TABLET | ORAL | Status: DC | PRN

## 2012-03-26 MED ORDER — SODIUM CHLORIDE 0.9 % IV SOLN
1000.0000 mg | Freq: Two times a day (BID) | INTRAVENOUS | Status: DC
  Administered 2012-03-26: 1000 mg via INTRAVENOUS
  Filled 2012-03-26 (×2): qty 10

## 2012-03-26 MED ORDER — LEVETIRACETAM 500 MG PO TABS: 500.0000 mg | ORAL_TABLET | Freq: Two times a day (BID) | ORAL | Status: DC

## 2012-03-26 NOTE — ED Notes (Addendum)
PT AO x4. Pt non postischial. Pt denies ever having seizures and denies seizure today. States last year "I fell and that's why my wife thought I had a seizure."  No injury noted to tongue.

## 2012-03-26 NOTE — ED Provider Notes (Signed)
History     CSN: [id number]  Arrival date & time 03/26/12  1729   First MD Initiated Contact with Patient 03/26/12 1732      Chief Complaint  Patient presents with  . Seizures    (Consider location/radiation/quality/duration/timing/severity/associated sxs/prior treatment) Patient is a 62 y.o. male presenting with seizures. The history is provided by the patient and the spouse.  Seizures  This is a recurrent problem. The current episode started 1 to 2 hours ago. The problem has been resolved. There was 1 seizure. The most recent episode lasted 2 to 5 minutes. Associated symptoms include cough. Pertinent negatives include no confusion, no headaches, no visual disturbance, no chest pain, no nausea, no vomiting and no diarrhea. Characteristics include eye deviation, rhythmic jerking and loss of consciousness. The episode was witnessed. The seizures did not continue in the ED. The seizure(s) had no focality. Possible causes include med or dosage change and recent illness. There has been no fever. There were no medications administered prior to arrival.    Past Medical History  Diagnosis Date  . Depression     Past Surgical History  Procedure Date  . Back surgery   . Lithotripsy   . Hernia repair     No family history on file.  History  Substance Use Topics  . Smoking status: Former Games developer  . Smokeless tobacco: Never Used  . Alcohol Use: No      Review of Systems  Constitutional: Negative for fever and fatigue.  HENT: Negative for congestion, rhinorrhea and postnasal drip.   Eyes: Negative for photophobia and visual disturbance.  Respiratory: Positive for cough. Negative for chest tightness, shortness of breath and wheezing.   Cardiovascular: Negative for chest pain, palpitations and leg swelling.  Gastrointestinal: Negative for nausea, vomiting, abdominal pain and diarrhea.  Genitourinary: Negative for urgency, frequency and difficulty urinating.  Musculoskeletal:  Negative for back pain and arthralgias.  Skin: Negative for rash and wound.  Neurological: Positive for seizures and loss of consciousness. Negative for weakness and headaches.  Psychiatric/Behavioral: Negative for confusion and agitation.    Allergies  Review of patient's allergies indicates no known allergies.  Home Medications   Current Outpatient Rx  Name  Route  Sig  Dispense  Refill  . ALPRAZOLAM 0.5 MG PO TABS   Oral   Take 0.5 mg by mouth at bedtime as needed. For sleep         . EXCEDRIN PO   Oral   Take 5 tablets by mouth 2 (two) times daily as needed. For pain         . BUPROPION HCL ER (XL) 300 MG PO TB24   Oral   Take 300 mg by mouth 3 (three) times daily.          Marland Kitchen VITAMIN D3 10000 UNITS PO CAPS   Oral   Take 10,000 Units by mouth every evening.         Marland Kitchen VITAMIN B-12 PO   Oral   Take 1 tablet by mouth every evening.         . ADULT MULTIVITAMIN W/MINERALS CH   Oral   Take 2 tablets by mouth every evening.          Marland Kitchen FISH OIL PO   Oral   Take 2 capsules by mouth every evening.         Marland Kitchen PREGABALIN 150 MG PO CAPS   Oral   Take 150 mg by mouth 2 (two) times daily.          Marland Kitchen  LEVETIRACETAM 500 MG PO TABS   Oral   Take 1 tablet (500 mg total) by mouth every 12 (twelve) hours.   60 tablet   0   . OXYCODONE-ACETAMINOPHEN 5-325 MG PO TABS   Oral   Take 1 tablet by mouth every 4 (four) hours as needed for pain.   6 tablet   0     BP 120/79  Pulse 72  Temp 98.3 F (36.8 C) (Oral)  Resp 13  SpO2 97%  Physical Exam  Nursing note and vitals reviewed. Constitutional: He is oriented to person, place, and time. He appears well-developed and well-nourished. No distress.  HENT:  Head: Normocephalic and atraumatic.  Mouth/Throat: Oropharynx is clear and moist.  Eyes: EOM are normal. Pupils are equal, round, and reactive to light.  Neck: Normal range of motion. Neck supple.  Cardiovascular: Normal rate, regular rhythm, normal heart  sounds and intact distal pulses.   Pulmonary/Chest: Effort normal and breath sounds normal. He has no wheezes. He has no rales.  Abdominal: Soft. Bowel sounds are normal. He exhibits no distension. There is no tenderness. There is no rebound and no guarding.  Musculoskeletal: Normal range of motion. He exhibits no edema and no tenderness.  Lymphadenopathy:    He has no cervical adenopathy.  Neurological: He is alert and oriented to person, place, and time. He displays normal reflexes. No cranial nerve deficit. He exhibits normal muscle tone. Coordination normal.       Ambulates without difficulty  Skin: Skin is warm and dry. No rash noted.  Psychiatric: He has a normal mood and affect. His behavior is normal.    ED Course  Procedures (including critical care time)  Labs Reviewed  CBC WITH DIFFERENTIAL - Abnormal; Notable for the following:    RBC 4.08 (*)     Hemoglobin 12.3 (*)     HCT 36.1 (*)     Eosinophils Relative 6 (*)     All other components within normal limits  BASIC METABOLIC PANEL - Abnormal; Notable for the following:    Sodium 134 (*)     GFR calc non Af Amer 87 (*)     All other components within normal limits   Dg Chest 2 View  03/26/2012  *RADIOLOGY REPORT*  Clinical Data: Cough, congestion and fever.  CHEST - 2 VIEW  Comparison: 03/08/2012.  Findings: The cardiac silhouette, mediastinal and hilar contours are within normal limits and stable.  There are emphysematous appearing changes and chronic interstitial pulmonary markings.  No definite infiltrate or effusion.  The bony thorax is intact.  IMPRESSION: Chronic lung changes.  No acute overlying pulmonary process.   Original Report Authenticated By: Rudie Meyer, M.D.      1. Seizure   2. Rib pain on right side       MDM  67M presents to ED after 1 seizure today. Pt had 2 seizures last year (6/13) and evaluated here. He had an MRI which was negative and was started on Keppra. Instructed to f/u with a  neurologist but patient never did due to financial concerns. He also never started his Keppra. He was seen here about 3 weeks ago after a fall and diagnosed with a right-sided rib fracture. He has since developed a cough and low-grade fever for the last week. The seizure today was witnessed by his wife. It was GTC and lasted about 3 minutes. Pt now back to neurologic baseline. Was not given anything for seizure PTA. Exam as noted above.  Will obtain CXR, labs, and re-load him with Keppra.   CXR not concerning for pneumonia. Labs unrevealing. No leukocytosis, hypoglycemia, or significant electrolyte abnormalities. Given loading dose of Keppra and will d/c with Keppra 500 mg BID. Given number for neurology and instructed them to f/u. Stable for d/c home. Return precautions given.       Johnnette Gourd, MD 03/27/12 0020

## 2012-03-26 NOTE — ED Notes (Signed)
Pt undressed, in gown, on monitor, continuous pulse oximetry and blood pressure cuff 

## 2012-03-26 NOTE — ED Notes (Signed)
Wife at bedside. Wife states "at dinner he screamed bloody murder, his eyes were wide open and he wouldn't blink and his whole body way shaking." Reports it last appx 3 min. Wife reports that this happened 2x last year but due to financial difficulty he never saw a neurologist. Pt denies any sickness but reports breaking rib at end of Dec. Wife and husband seem to have conflicting stories.

## 2012-03-26 NOTE — ED Notes (Signed)
No distress noted.  A/ox 3 currently awaiting Keppra from Rx.  Pt states that he is comfortable at the present.  Lab in room to draw labs.

## 2012-03-26 NOTE — ED Notes (Signed)
Per EMS: Pt from home. Wife witnessed pt having "full body" seizure lasting appx 1 min. Denies pt falling from chair. Wife states pt had 2 seizures last year but isn't taking any rx. Pt denies ever having any seizures. Pt postictical on EMS; AO x3. No injury noted. Tongue intact. No incontinence.

## 2012-03-29 NOTE — ED Provider Notes (Signed)
I saw and evaluated the patient, reviewed the resident's note and I agree with the findings and plan.. patient with seizure. History same and was not started on his medicines. He was loaded on Keppra will be discharged home.  Juliet Rude. Rubin Payor, MD 03/29/12 1530

## 2012-05-03 ENCOUNTER — Other Ambulatory Visit: Payer: Self-pay | Admitting: Neurological Surgery

## 2012-05-12 ENCOUNTER — Other Ambulatory Visit: Payer: Self-pay

## 2012-05-19 ENCOUNTER — Ambulatory Visit
Admission: RE | Admit: 2012-05-19 | Discharge: 2012-05-19 | Disposition: A | Discharge status: Home / Self Care | Payer: BC Managed Care – PPO | Source: Ambulatory Visit | Attending: Neurological Surgery | Admitting: Neurological Surgery

## 2012-05-19 DIAGNOSIS — M48061 Spinal stenosis, lumbar region without neurogenic claudication: Secondary | ICD-10-CM

## 2012-08-04 ENCOUNTER — Other Ambulatory Visit: Payer: Self-pay | Admitting: Neurological Surgery

## 2012-08-10 ENCOUNTER — Encounter (HOSPITAL_COMMUNITY): Payer: Self-pay | Admitting: Pharmacy Technician

## 2012-08-17 ENCOUNTER — Encounter (HOSPITAL_COMMUNITY): Admission: RE | Payer: Self-pay | Source: Ambulatory Visit

## 2012-08-17 ENCOUNTER — Inpatient Hospital Stay (HOSPITAL_COMMUNITY): Admission: RE | Admit: 2012-08-17 | Payer: Self-pay | Source: Ambulatory Visit | Admitting: Neurological Surgery

## 2012-08-17 SURGERY — ANTERIOR LATERAL LUMBAR FUSION 1 LEVEL
Anesthesia: General

## 2013-02-01 ENCOUNTER — Emergency Department (HOSPITAL_COMMUNITY)
Admission: EM | Admit: 2013-02-01 | Discharge: 2013-02-01 | Disposition: A | Discharge status: Home / Self Care | Payer: No Typology Code available for payment source | Attending: Emergency Medicine | Admitting: Emergency Medicine

## 2013-02-01 ENCOUNTER — Encounter (HOSPITAL_COMMUNITY): Payer: Self-pay | Admitting: Emergency Medicine

## 2013-02-01 DIAGNOSIS — F3289 Other specified depressive episodes: Secondary | ICD-10-CM | POA: Insufficient documentation

## 2013-02-01 DIAGNOSIS — F329 Major depressive disorder, single episode, unspecified: Secondary | ICD-10-CM | POA: Insufficient documentation

## 2013-02-01 DIAGNOSIS — B349 Viral infection, unspecified: Secondary | ICD-10-CM

## 2013-02-01 DIAGNOSIS — Z9889 Other specified postprocedural states: Secondary | ICD-10-CM | POA: Insufficient documentation

## 2013-02-01 DIAGNOSIS — Z87891 Personal history of nicotine dependence: Secondary | ICD-10-CM | POA: Insufficient documentation

## 2013-02-01 DIAGNOSIS — Z79899 Other long term (current) drug therapy: Secondary | ICD-10-CM | POA: Insufficient documentation

## 2013-02-01 DIAGNOSIS — R112 Nausea with vomiting, unspecified: Secondary | ICD-10-CM | POA: Insufficient documentation

## 2013-02-01 DIAGNOSIS — IMO0001 Reserved for inherently not codable concepts without codable children: Secondary | ICD-10-CM | POA: Insufficient documentation

## 2013-02-01 DIAGNOSIS — B9789 Other viral agents as the cause of diseases classified elsewhere: Secondary | ICD-10-CM | POA: Insufficient documentation

## 2013-02-01 DIAGNOSIS — R197 Diarrhea, unspecified: Secondary | ICD-10-CM | POA: Insufficient documentation

## 2013-02-01 LAB — CBC WITH DIFFERENTIAL/PLATELET
Eosinophils Absolute: 0.1 10*3/uL (ref 0.0–0.7)
Hemoglobin: 13.4 g/dL (ref 13.0–17.0)
Lymphocytes Relative: 20 % (ref 12–46)
Lymphs Abs: 1.6 10*3/uL (ref 0.7–4.0)
MCH: 31.2 pg (ref 26.0–34.0)
Monocytes Relative: 10 % (ref 3–12)
Neutro Abs: 5.7 10*3/uL (ref 1.7–7.7)
Neutrophils Relative %: 70 % (ref 43–77)
RBC: 4.29 MIL/uL (ref 4.22–5.81)
RDW: 14.6 % (ref 11.5–15.5)

## 2013-02-01 LAB — BASIC METABOLIC PANEL
BUN: 7 mg/dL (ref 6–23)
CO2: 18 mEq/L — ABNORMAL LOW (ref 19–32)
Calcium: 8.9 mg/dL (ref 8.4–10.5)
Chloride: 101 mEq/L (ref 96–112)
Glucose, Bld: 93 mg/dL (ref 70–99)
Potassium: 2.9 mEq/L — ABNORMAL LOW (ref 3.5–5.1)

## 2013-02-01 MED ORDER — KETOROLAC TROMETHAMINE 30 MG/ML IJ SOLN
30.0000 mg | Freq: Once | INTRAMUSCULAR | Status: AC
  Administered 2013-02-01: 30 mg via INTRAVENOUS
  Filled 2013-02-01: qty 1

## 2013-02-01 MED ORDER — ONDANSETRON HCL 4 MG PO TABS: 4.0000 mg | ORAL_TABLET | Freq: Four times a day (QID) | ORAL | Status: DC

## 2013-02-01 MED ORDER — SODIUM CHLORIDE 0.9 % IV BOLUS (SEPSIS)
500.0000 mL | Freq: Once | INTRAVENOUS | Status: AC
  Administered 2013-02-01: 1000 mL via INTRAVENOUS

## 2013-02-01 MED ORDER — DIPHENOXYLATE-ATROPINE 2.5-0.025 MG PO TABS: 1.0000 | ORAL_TABLET | Freq: Four times a day (QID) | ORAL | Status: DC | PRN

## 2013-02-01 MED ORDER — ONDANSETRON HCL 4 MG/2ML IJ SOLN
4.0000 mg | Freq: Once | INTRAMUSCULAR | Status: AC
  Administered 2013-02-01: 4 mg via INTRAVENOUS
  Filled 2013-02-01: qty 2

## 2013-02-01 NOTE — ED Provider Notes (Signed)
CSN: [id number]     Arrival date & time 02/01/13  4098 History   First MD Initiated Contact with Patient 02/01/13 [phone]     Chief Complaint  Patient presents with  . Abdominal Pain   (Consider location/radiation/quality/duration/timing/severity/associated sxs/prior Treatment) HPI Comments: Patient presents to the ER for a flulike symptoms. Patient reports that he first developed any symptoms 3-4 days ago. Patient reports that he has had nausea, vomiting, diarrhea. There is abdominal cramping associated with the vomiting and diarrhea, no consistent abdominal pain. Patient reports that he has had generalized body achesn o documented fever. He has taken over-the-counter medications but they have not helped. Patient denies sore throat, cough, chest pain, difficulty breathing. Patient presents with his wife who has similar symptoms.  Patient is a 62 y.o. male presenting with abdominal pain.  Abdominal Pain Associated symptoms: nausea and vomiting     Past Medical History  Diagnosis Date  . Depression    Past Surgical History  Procedure Laterality Date  . Back surgery    . Lithotripsy    . Hernia repair     No family history on file. History  Substance Use Topics  . Smoking status: Former Games developer  . Smokeless tobacco: Never Used  . Alcohol Use: No    Review of Systems  Gastrointestinal: Positive for nausea, vomiting and abdominal pain.  Musculoskeletal: Positive for myalgias.  All other systems reviewed and are negative.    Allergies  Review of patient's allergies indicates no known allergies.  Home Medications   Current Outpatient Rx  Name  Route  Sig  Dispense  Refill  . ALPRAZolam (XANAX) 0.5 MG tablet   Oral   Take 0.5 mg by mouth at bedtime as needed. For sleep         . Aspirin-Acetaminophen-Caffeine (EXCEDRIN PO)   Oral   Take 5 tablets by mouth 2 (two) times daily as needed. For pain         . Cholecalciferol (VITAMIN D3) 10000 UNITS capsule   Oral   Take  10,000 Units by mouth every evening.         . Cyanocobalamin (VITAMIN B-12 PO)   Oral   Take 1 tablet by mouth every evening.         . levETIRAcetam (KEPPRA) 500 MG tablet   Oral   Take 1 tablet (500 mg total) by mouth every 12 (twelve) hours.   60 tablet   0   . Multiple Vitamin (MULITIVITAMIN WITH MINERALS) TABS   Oral   Take 2 tablets by mouth every evening.          . Omega-3 Fatty Acids (FISH OIL PO)   Oral   Take 2 capsules by mouth every evening.         Marland Kitchen oxyCODONE-acetaminophen (PERCOCET/ROXICET) 5-325 MG per tablet   Oral   Take 1 tablet by mouth every 6 (six) hours as needed for moderate pain or severe pain.         . pregabalin (LYRICA) 150 MG capsule   Oral   Take 150 mg by mouth 2 (two) times daily.          . traMADol (ULTRAM) 50 MG tablet   Oral   Take 50 mg by mouth daily.          BP 128/81  Pulse 84  Temp(Src) 98.3 F (36.8 C) (Oral)  Resp 18  Ht 6\' 1"  (1.854 m)  Wt 212 lb (96.163 kg)  BMI 27.98 kg/m2  SpO2 96% Physical Exam  Constitutional: He is oriented to person, place, and time. He appears well-developed and well-nourished. No distress.  HENT:  Head: Normocephalic and atraumatic.  Right Ear: Hearing normal.  Left Ear: Hearing normal.  Nose: Nose normal.  Mouth/Throat: Oropharynx is clear and moist and mucous membranes are normal.  Eyes: Conjunctivae and EOM are normal. Pupils are equal, round, and reactive to light.  Neck: Normal range of motion. Neck supple.  Cardiovascular: Regular rhythm, S1 normal and S2 normal.  Exam reveals no gallop and no friction rub.   No murmur heard. Pulmonary/Chest: Effort normal and breath sounds normal. No respiratory distress. He exhibits no tenderness.  Abdominal: Soft. Normal appearance and bowel sounds are normal. There is no hepatosplenomegaly. There is no tenderness. There is no rebound, no guarding, no tenderness at McBurney's point and negative Murphy's sign. No hernia.   Musculoskeletal: Normal range of motion.  Neurological: He is alert and oriented to person, place, and time. He has normal strength. No cranial nerve deficit or sensory deficit. Coordination normal. GCS eye subscore is 4. GCS verbal subscore is 5. GCS motor subscore is 6.  Skin: Skin is warm, dry and intact. No rash noted. No cyanosis.  Psychiatric: He has a normal mood and affect. His speech is normal and behavior is normal. Thought content normal.    ED Course  Procedures (including critical care time) Labs Review Labs Reviewed  CBC WITH DIFFERENTIAL  BASIC METABOLIC PANEL   Imaging Review No results found.  EKG Interpretation   None       MDM  Diagnosis: Viral Syndrome  Patient presents to the ER with flulike symptoms. Patient experiencing nausea, vomiting, diarrhea for several days. Spouse treated today for identical symptoms. He has a benign abdominal exam. Work was unremarkable. Vital signs normal. This consistent with viral process. Patient was treated with IV fluids and symptomatic medications. Will be discharged, continue Zofran, Lomotil as needed.    Gilda Crease, MD 02/01/13 [phone]

## 2013-02-01 NOTE — ED Notes (Signed)
Patient is  Resting on stretcher , rates pain at 7/10 in abd. And head

## 2013-02-01 NOTE — ED Notes (Signed)
Patient here with wife with same symptoms, abdominal pain, nausea vomiting, diarrhea x 3 days

## 2013-05-14 ENCOUNTER — Other Ambulatory Visit: Payer: Self-pay | Admitting: Neurological Surgery

## 2013-05-14 DIAGNOSIS — M549 Dorsalgia, unspecified: Secondary | ICD-10-CM

## 2013-05-21 ENCOUNTER — Ambulatory Visit
Admission: RE | Admit: 2013-05-21 | Discharge: 2013-05-21 | Disposition: A | Discharge status: Home / Self Care | Payer: No Typology Code available for payment source | Source: Ambulatory Visit | Attending: Neurological Surgery | Admitting: Neurological Surgery

## 2013-05-21 DIAGNOSIS — M549 Dorsalgia, unspecified: Secondary | ICD-10-CM

## 2013-07-17 ENCOUNTER — Other Ambulatory Visit: Payer: Self-pay | Admitting: Neurological Surgery

## 2013-07-26 ENCOUNTER — Inpatient Hospital Stay (HOSPITAL_COMMUNITY): Admission: RE | Admit: 2013-07-26 | Payer: BC Managed Care – PPO | Source: Ambulatory Visit

## 2013-08-02 ENCOUNTER — Encounter (HOSPITAL_COMMUNITY): Admission: RE | Payer: Self-pay | Source: Ambulatory Visit

## 2013-08-02 ENCOUNTER — Inpatient Hospital Stay (HOSPITAL_COMMUNITY)
Admission: RE | Admit: 2013-08-02 | Payer: BC Managed Care – PPO | Source: Ambulatory Visit | Admitting: Neurological Surgery

## 2013-08-02 SURGERY — POSTERIOR LUMBAR FUSION 1 WITH HARDWARE REMOVAL
Anesthesia: General | Site: Back

## 2013-10-26 ENCOUNTER — Other Ambulatory Visit: Payer: Self-pay | Admitting: Family Medicine

## 2013-10-26 DIAGNOSIS — M25562 Pain in left knee: Secondary | ICD-10-CM

## 2013-10-26 DIAGNOSIS — R609 Edema, unspecified: Secondary | ICD-10-CM

## 2013-10-29 ENCOUNTER — Ambulatory Visit
Admission: RE | Admit: 2013-10-29 | Discharge: 2013-10-29 | Disposition: A | Discharge status: Home / Self Care | Payer: No Typology Code available for payment source | Source: Ambulatory Visit | Attending: Family Medicine | Admitting: Family Medicine

## 2013-10-29 DIAGNOSIS — M25562 Pain in left knee: Secondary | ICD-10-CM

## 2013-10-29 DIAGNOSIS — R609 Edema, unspecified: Secondary | ICD-10-CM

## 2015-02-10 ENCOUNTER — Ambulatory Visit
Admission: RE | Admit: 2015-02-10 | Discharge: 2015-02-10 | Disposition: A | Discharge status: Home / Self Care | Payer: Medicare Other | Source: Ambulatory Visit | Attending: Family Medicine | Admitting: Family Medicine

## 2015-02-10 ENCOUNTER — Other Ambulatory Visit: Payer: Self-pay | Admitting: Family Medicine

## 2015-02-10 DIAGNOSIS — R609 Edema, unspecified: Secondary | ICD-10-CM

## 2015-02-10 DIAGNOSIS — M25562 Pain in left knee: Secondary | ICD-10-CM

## 2015-02-26 ENCOUNTER — Emergency Department (HOSPITAL_COMMUNITY)
Admission: EM | Admit: 2015-02-26 | Discharge: 2015-02-26 | Disposition: A | Discharge status: Home / Self Care | Payer: Self-pay

## 2015-04-05 ENCOUNTER — Emergency Department (HOSPITAL_COMMUNITY): Payer: Medicare Other

## 2015-04-05 ENCOUNTER — Emergency Department (HOSPITAL_COMMUNITY)
Admission: EM | Admit: 2015-04-05 | Discharge: 2015-04-05 | Disposition: A | Discharge status: Home / Self Care | Payer: Medicare Other | Attending: Emergency Medicine | Admitting: Emergency Medicine

## 2015-04-05 ENCOUNTER — Encounter (HOSPITAL_COMMUNITY): Payer: Self-pay | Admitting: Emergency Medicine

## 2015-04-05 DIAGNOSIS — R630 Anorexia: Secondary | ICD-10-CM | POA: Diagnosis not present

## 2015-04-05 DIAGNOSIS — R51 Headache: Secondary | ICD-10-CM | POA: Diagnosis not present

## 2015-04-05 DIAGNOSIS — Z87891 Personal history of nicotine dependence: Secondary | ICD-10-CM | POA: Insufficient documentation

## 2015-04-05 DIAGNOSIS — Z7982 Long term (current) use of aspirin: Secondary | ICD-10-CM | POA: Diagnosis not present

## 2015-04-05 DIAGNOSIS — F329 Major depressive disorder, single episode, unspecified: Secondary | ICD-10-CM | POA: Diagnosis not present

## 2015-04-05 DIAGNOSIS — R1084 Generalized abdominal pain: Secondary | ICD-10-CM | POA: Diagnosis not present

## 2015-04-05 DIAGNOSIS — Z79899 Other long term (current) drug therapy: Secondary | ICD-10-CM | POA: Insufficient documentation

## 2015-04-05 DIAGNOSIS — R197 Diarrhea, unspecified: Secondary | ICD-10-CM | POA: Diagnosis not present

## 2015-04-05 DIAGNOSIS — R110 Nausea: Secondary | ICD-10-CM | POA: Diagnosis not present

## 2015-04-05 DIAGNOSIS — I10 Essential (primary) hypertension: Secondary | ICD-10-CM | POA: Diagnosis not present

## 2015-04-05 HISTORY — DX: Essential (primary) hypertension: I10

## 2015-04-05 LAB — CBC
HEMATOCRIT: 36.9 % — AB (ref 39.0–52.0)
HEMOGLOBIN: 12.6 g/dL — AB (ref 13.0–17.0)
MCH: 29.9 pg (ref 26.0–34.0)
MCHC: 34.1 g/dL (ref 30.0–36.0)
MCV: 87.6 fL (ref 78.0–100.0)
Platelets: 318 10*3/uL (ref 150–400)
RBC: 4.21 MIL/uL — ABNORMAL LOW (ref 4.22–5.81)
RDW: 14.7 % (ref 11.5–15.5)
WBC: 9.3 10*3/uL (ref 4.0–10.5)

## 2015-04-05 LAB — COMPREHENSIVE METABOLIC PANEL
ALT: 23 U/L (ref 17–63)
ANION GAP: 10 (ref 5–15)
AST: 26 U/L (ref 15–41)
Albumin: 3 g/dL — ABNORMAL LOW (ref 3.5–5.0)
Alkaline Phosphatase: 113 U/L (ref 38–126)
BILIRUBIN TOTAL: 0.4 mg/dL (ref 0.3–1.2)
CO2: 25 mmol/L (ref 22–32)
Calcium: 8.6 mg/dL — ABNORMAL LOW (ref 8.9–10.3)
Chloride: 107 mmol/L (ref 101–111)
Creatinine, Ser: 0.81 mg/dL (ref 0.61–1.24)
GFR calc Af Amer: >60 mL/min (ref >60)
GFR calc non Af Amer: >60 mL/min (ref >60)
GLUCOSE: 100 mg/dL — AB (ref 65–99)
POTASSIUM: 3.3 mmol/L — AB (ref 3.5–5.1)
SODIUM: 142 mmol/L (ref 135–145)
Total Protein: 6.5 g/dL (ref 6.5–8.1)

## 2015-04-05 LAB — URINALYSIS, ROUTINE W REFLEX MICROSCOPIC
BILIRUBIN URINE: NEGATIVE
GLUCOSE, UA: NEGATIVE mg/dL
HGB URINE DIPSTICK: NEGATIVE
KETONES UR: NEGATIVE mg/dL
Leukocytes, UA: NEGATIVE
Nitrite: NEGATIVE
PH: 5.5 (ref 5.0–8.0)
Protein, ur: NEGATIVE mg/dL
SPECIFIC GRAVITY, URINE: 1.004 — AB (ref 1.005–1.030)

## 2015-04-05 LAB — LIPASE, BLOOD: Lipase: 24 U/L (ref 11–51)

## 2015-04-05 MED ORDER — SODIUM CHLORIDE 0.9 % IV BOLUS (SEPSIS)
1000.0000 mL | Freq: Once | INTRAVENOUS | Status: AC
  Administered 2015-04-05: 1000 mL via INTRAVENOUS

## 2015-04-05 MED ORDER — IOHEXOL 300 MG/ML  SOLN
100.0000 mL | Freq: Once | INTRAMUSCULAR | Status: AC | PRN
  Administered 2015-04-05: 100 mL via INTRAVENOUS

## 2015-04-05 MED ORDER — LOPERAMIDE HCL 2 MG PO CAPS: 2.0000 mg | ORAL_CAPSULE | Freq: Four times a day (QID) | ORAL | Status: DC | PRN

## 2015-04-05 MED ORDER — OXYCODONE-ACETAMINOPHEN 5-325 MG PO TABS: 1.0000 | ORAL_TABLET | Freq: Four times a day (QID) | ORAL | Status: DC | PRN

## 2015-04-05 NOTE — ED Notes (Signed)
Called pt x2 and no answer. Nurse first stated they would keep an eye out if pt returns.

## 2015-04-05 NOTE — ED Notes (Signed)
Pt glasses now missing. Pt continues to repeat that he sat down on them and broke them. This RN unable to locate his glasses. This RN has requested to see the pts glasses several times. Pt will not display them to this RN

## 2015-04-05 NOTE — Discharge Instructions (Signed)
As discussed, your evaluation today has been largely reassuring.  But, it is important that you monitor your condition carefully, and do not hesitate to return to the ED if you develop new, or concerning changes in your condition. ? ?Otherwise, please follow-up with your physician for appropriate ongoing care. ? ?

## 2015-04-05 NOTE — ED Notes (Signed)
No answer  ?? Pt in restroom

## 2015-04-05 NOTE — ED Provider Notes (Signed)
CSN: [id number]     Arrival date & time 04/05/15  1915 History   First MD Initiated Contact with Patient 04/05/15 1959     Chief Complaint  Patient presents with  . Diarrhea    HPI  Patient presents with concern of ongoing nausea, headache or diarrhea. Symptoms began about one week ago. Since onset symptoms of been persistent. There is mild associated anorexia, but no vomiting. The abdominal discomfort is that, without focal pain. No fever, chills. Prior to the onset of symptoms, no recent traveling, medication changes, diet changes, activity changes. No recent antibiotics use, hospitalization. Since onset, no clear alleviating or exacerbating factors. There is some associated right superior lateral shoulder pain.  Past Medical History  Diagnosis Date  . Depression   . Hypertension    Past Surgical History  Procedure Laterality Date  . Back surgery    . Lithotripsy    . Hernia repair     No family history on file. Social History  Substance Use Topics  . Smoking status: Former Games developer  . Smokeless tobacco: Never Used  . Alcohol Use: No    Review of Systems  Constitutional:       Per HPI, otherwise negative  HENT:       Per HPI, otherwise negative  Respiratory:       Per HPI, otherwise negative  Cardiovascular:       Per HPI, otherwise negative  Gastrointestinal: Positive for nausea and diarrhea. Negative for vomiting and blood in stool.  Endocrine:       Negative aside from HPI  Genitourinary:       Neg aside from HPI   Musculoskeletal:       Per HPI, otherwise negative  Skin: Negative.   Neurological: Negative for syncope.      Allergies  Review of patient's allergies indicates no known allergies.  Home Medications   Prior to Admission medications   Medication Sig Start Date End Date Taking? Authorizing Provider  ALPRAZolam Prudy Feeler) 0.5 MG tablet Take 0.5 mg by mouth at bedtime as needed. For sleep   Yes Historical Provider, MD  Multiple Vitamin  (MULITIVITAMIN WITH MINERALS) TABS Take 2 tablets by mouth every evening.    Yes Historical Provider, MD  pregabalin (LYRICA) 150 MG capsule Take 150 mg by mouth 2 (two) times daily as needed.    Yes Historical Provider, MD  traMADol (ULTRAM) 50 MG tablet Take 50 mg by mouth every 12 (twelve) hours as needed for severe pain.    Yes Historical Provider, MD  Aspirin-Acetaminophen-Caffeine (EXCEDRIN PO) Take 5 tablets by mouth 2 (two) times daily as needed. For pain    Historical Provider, MD  Cholecalciferol (VITAMIN D3) 10000 UNITS capsule Take 10,000 Units by mouth every evening.    Historical Provider, MD  Cyanocobalamin (VITAMIN B-12 PO) Take 1 tablet by mouth every evening.    Historical Provider, MD  diphenoxylate-atropine (LOMOTIL) 2.5-0.025 MG per tablet Take 1 tablet by mouth 4 (four) times daily as needed for diarrhea or loose stools. 02/01/13   Gilda Crease, MD  levETIRAcetam (KEPPRA) 500 MG tablet Take 1 tablet (500 mg total) by mouth every 12 (twelve) hours. 03/26/12   Johnnette Gourd, MD  loperamide (IMODIUM) 2 MG capsule Take 1 capsule (2 mg total) by mouth 4 (four) times daily as needed for diarrhea or loose stools. 04/05/15   Gerhard Munch, MD  Omega-3 Fatty Acids (FISH OIL PO) Take 2 capsules by mouth every evening.    Historical  Provider, MD  ondansetron (ZOFRAN) 4 MG tablet Take 1 tablet (4 mg total) by mouth every 6 (six) hours. 02/01/13   Gilda Crease, MD  oxyCODONE-acetaminophen (PERCOCET/ROXICET) 5-325 MG tablet Take 1 tablet by mouth every 6 (six) hours as needed for severe pain. 04/05/15   Gerhard Munch, MD   BP 155/90 mmHg  Pulse 80  Temp(Src) 97.4 F (36.3 C) (Oral)  Resp 18  Ht 6' 1.5" (1.867 m)  Wt 219 lb (99.338 kg)  BMI 28.50 kg/m2  SpO2 95% Physical Exam  Constitutional: He is oriented to person, place, and time. He appears well-developed. No distress.  HENT:  Head: Normocephalic and atraumatic.  Eyes: Conjunctivae and EOM are normal.   Cardiovascular: Normal rate and regular rhythm.   Pulmonary/Chest: Effort normal. No stridor. No respiratory distress.  Abdominal: He exhibits no distension.  Mild diffuse discomfort with palpation, no guarding, rebound.  Musculoskeletal: He exhibits no edema.  Neurological: He is alert and oriented to person, place, and time.  Skin: Skin is warm and dry.  Psychiatric: He has a normal mood and affect.  Nursing note and vitals reviewed.   ED Course  Procedures (including critical care time) Labs Review Labs Reviewed  COMPREHENSIVE METABOLIC PANEL - Abnormal; Notable for the following:    Potassium 3.3 (*)    Glucose, Bld 100 (*)    BUN <5 (*)    Calcium 8.6 (*)    Albumin 3.0 (*)    All other components within normal limits  CBC - Abnormal; Notable for the following:    RBC 4.21 (*)    Hemoglobin 12.6 (*)    HCT 36.9 (*)    All other components within normal limits  URINALYSIS, ROUTINE W REFLEX MICROSCOPIC (NOT AT [facility]) - Abnormal; Notable for the following:    Specific Gravity, Urine 1.004 (*)    All other components within normal limits  LIPASE, BLOOD    Imaging Review Ct Abdomen Pelvis W Contrast  04/05/2015  CLINICAL DATA:  Acute onset of generalized abdominal discomfort. Assess for hernia. Personal history of hernia repair. Diarrhea for 1 week. Initial encounter. EXAM: CT ABDOMEN AND PELVIS WITH CONTRAST TECHNIQUE: Multidetector CT imaging of the abdomen and pelvis was performed using the standard protocol following bolus administration of intravenous contrast. CONTRAST:  OMNIPAQUE IOHEXOL 300 MG/ML  SOLN COMPARISON:  MRI of the lumbar spine performed 05/21/2013 FINDINGS: The visualized lung bases are clear. A 1.5 cm cyst is noted at the left hepatic lobe. The liver and spleen are otherwise unremarkable. The gallbladder is within normal limits. The pancreas and adrenal glands are unremarkable. Nonspecific perinephric stranding is noted bilaterally. The kidneys are  otherwise unremarkable. There is no evidence of hydronephrosis. No renal or ureteral stones are seen. No free fluid is identified. The small bowel is unremarkable in appearance. The stomach is within normal limits. No acute vascular abnormalities are seen. The appendix is normal in caliber, without evidence of appendicitis. The colon is grossly unremarkable in appearance, aside from a few calcified epiploic appendages along the sigmoid colon. The bladder is mildly distended and grossly unremarkable. The prostate remains normal in size. No inguinal lymphadenopathy is seen. A small right inguinal hernia is seen, containing only fat. No abdominal wall hernia is seen to explain the patient's symptoms. No acute osseous abnormalities are identified. The patient is status post lumbar spinal fusion at L3-L5, with underlying decompression. Adjacent degenerative change is noted at L2-L3. IMPRESSION: 1. No acute abnormality seen within the abdomen  or pelvis. 2. Small right inguinal hernia seen, containing only fat. No abdominal wall hernia seen to explain the patient's symptoms. 3. Small hepatic cyst noted. Electronically Signed   By: Roanna Raider M.D.   On: 04/05/2015 22:49   I have personally reviewed and evaluated these images and lab results as part of my medical decision-making.  Initial labs were reassuring, some evidence for dehydration. Given the patient's persistent diarrhea, mild diffuse abdominal pain, CT scan performed.  11:42 PM Patient in no distress awake, alert, speaking clearly. We discussed all findings. Patient states that he is ready to go home. We discussed his ongoing diarrhea, the need for continued evaluation with his primary care physician.   MDM   Final diagnoses:  Diarrhea, unspecified type   patient presents with ongoing diarrhea, minimal abdominal discomfort. Given the patient's age, colitis, diverticulitis or considerations. Labs, CT scan reassuring, and the patient was  awake, alert,  hemodynamically stable throughout his ED course. With reassuring findings, the patient is appropriate for further evaluation, management to occur as an outpatient.   Gerhard Munch, MD 04/05/15 2342

## 2015-04-05 NOTE — ED Notes (Addendum)
Pt. reports diarrhea onset Sunday last week with headache , nausea , fatigue and right shoulder pain . Denies fever or chills .

## 2015-08-15 ENCOUNTER — Encounter (HOSPITAL_COMMUNITY): Payer: Self-pay | Admitting: Emergency Medicine

## 2015-08-15 ENCOUNTER — Ambulatory Visit (HOSPITAL_COMMUNITY)
Admission: EM | Admit: 2015-08-15 | Discharge: 2015-08-15 | Disposition: A | Discharge status: Home / Self Care | Payer: Medicare Other | Attending: Family Medicine | Admitting: Family Medicine

## 2015-08-15 DIAGNOSIS — J3489 Other specified disorders of nose and nasal sinuses: Secondary | ICD-10-CM

## 2015-08-15 DIAGNOSIS — J069 Acute upper respiratory infection, unspecified: Secondary | ICD-10-CM

## 2015-08-15 MED ORDER — TRAMADOL-ACETAMINOPHEN 37.5-325 MG PO TABS
1.0000 | ORAL_TABLET | Freq: Four times a day (QID) | ORAL | Status: DC | PRN
Start: 2015-08-15 — End: 2017-04-23

## 2015-08-15 MED ORDER — CETIRIZINE HCL 10 MG PO CAPS: ORAL_CAPSULE | ORAL | Status: DC

## 2015-08-15 MED ORDER — IPRATROPIUM BROMIDE 0.06 % NA SOLN: 2.0000 | Freq: Four times a day (QID) | NASAL | Status: DC

## 2015-08-15 NOTE — ED Provider Notes (Signed)
CSN: [id number]     Arrival date & time 08/15/15  1418 History   First MD Initiated Contact with Patient 08/15/15 1513     Chief Complaint  Patient presents with  . URI   (Consider location/radiation/quality/duration/timing/severity/associated sxs/prior Treatment) HPI Comments: 65 year old male complaining of a 2-3 day history of runny nose, nasal congestion, sneezing and headache. He is not taking any medications for the symptoms. He states his wife has the same symptoms, saw her PCP yesterday and was treated with an antibiotic. He denies PND, cough or fevers.  Patient is a 65 y.o. male presenting with URI.  URI Presenting symptoms: congestion, cough and rhinorrhea   Presenting symptoms: no ear pain, no fever and no sore throat   Associated symptoms: headaches and sneezing   Associated symptoms: no neck pain     Past Medical History  Diagnosis Date  . Depression   . Hypertension    Past Surgical History  Procedure Laterality Date  . Back surgery    . Lithotripsy    . Hernia repair     No family history on file. Social History  Substance Use Topics  . Smoking status: Former Games developermoker  . Smokeless tobacco: Never Used  . Alcohol Use: No    Review of Systems  Constitutional: Negative.  Negative for fever and chills.  HENT: Positive for congestion, postnasal drip, rhinorrhea and sneezing. Negative for ear discharge, ear pain, facial swelling, sore throat and trouble swallowing.   Eyes: Negative for pain, discharge and redness.  Respiratory: Positive for cough. Negative for chest tightness and shortness of breath.   Cardiovascular: Negative.   Gastrointestinal: Negative.   Musculoskeletal: Negative.  Negative for neck pain and neck stiffness.  Skin: Negative.   Neurological: Positive for headaches.  All other systems reviewed and are negative.   Allergies  Review of patient's allergies indicates no known allergies.  Home Medications   Prior to Admission medications    Medication Sig Start Date End Date Taking? Authorizing Provider  cloNIDine (CATAPRES) 0.2 MG tablet Take 0.2 mg by mouth 2 (two) times daily.   Yes Historical Provider, MD  pregabalin (LYRICA) 150 MG capsule Take 150 mg by mouth 2 (two) times daily as needed.    Yes Historical Provider, MD  traMADol (ULTRAM) 50 MG tablet Take 50 mg by mouth every 12 (twelve) hours as needed for severe pain.    Yes Historical Provider, MD  ALPRAZolam Prudy Feeler(XANAX) 0.5 MG tablet Take 0.5 mg by mouth at bedtime as needed. For sleep    Historical Provider, MD  Aspirin-Acetaminophen-Caffeine (EXCEDRIN PO) Take 5 tablets by mouth 2 (two) times daily as needed. For pain    Historical Provider, MD  Cetirizine HCl 10 MG CAPS 1 cap po daily for sneeze and runny nose 08/15/15   Hayden Rasmussenavid Arabell Neria, NP  Cholecalciferol (VITAMIN D3) 10000 UNITS capsule Take 10,000 Units by mouth every evening.    Historical Provider, MD  Cyanocobalamin (VITAMIN B-12 PO) Take 1 tablet by mouth every evening.    Historical Provider, MD  diphenoxylate-atropine (LOMOTIL) 2.5-0.025 MG per tablet Take 1 tablet by mouth 4 (four) times daily as needed for diarrhea or loose stools. 02/01/13   Gilda Creasehristopher J Pollina, MD  ipratropium (ATROVENT) 0.06 % nasal spray Place 2 sprays into both nostrils 4 (four) times daily. For runny nose 08/15/15   Hayden Rasmussenavid Ainsley Deakins, NP  levETIRAcetam (KEPPRA) 500 MG tablet Take 1 tablet (500 mg total) by mouth every 12 (twelve) hours. 03/26/12   Johnnette GourdErin Boyd, MD  loperamide (IMODIUM) 2 MG capsule Take 1 capsule (2 mg total) by mouth 4 (four) times daily as needed for diarrhea or loose stools. 04/05/15   Gerhard Munch, MD  Multiple Vitamin (MULITIVITAMIN WITH MINERALS) TABS Take 2 tablets by mouth every evening.     Historical Provider, MD  Omega-3 Fatty Acids (FISH OIL PO) Take 2 capsules by mouth every evening.    Historical Provider, MD  ondansetron (ZOFRAN) 4 MG tablet Take 1 tablet (4 mg total) by mouth every 6 (six) hours. 02/01/13   Gilda Crease, MD  traMADol-acetaminophen (ULTRACET) 37.5-325 MG tablet Take 1 tablet by mouth every 6 (six) hours as needed. For headache 08/15/15   Hayden Rasmussen, NP   Meds Ordered and Administered this Visit  Medications - No data to display  BP 140/82 mmHg  Pulse 72  Temp(Src) 97.6 F (36.4 C) (Oral)  Resp 16  SpO2 98% No data found.   Physical Exam  Constitutional: He is oriented to person, place, and time. He appears well-developed and well-nourished. No distress.  HENT:  Head: Normocephalic and atraumatic.  Left Ear: External ear normal.  Mouth/Throat: No oropharyngeal exudate.  Oropharynx with minor streaky erythema. No exudates.  TMs appear normal.  Eyes: Conjunctivae and EOM are normal.  Neck: Normal range of motion. Neck supple.  Cardiovascular: Normal rate, regular rhythm and normal heart sounds.   Pulmonary/Chest: Effort normal and breath sounds normal. No respiratory distress. He has no wheezes. He has no rales.  Musculoskeletal: Normal range of motion. He exhibits no edema.  Lymphadenopathy:    He has no cervical adenopathy.  Neurological: He is alert and oriented to person, place, and time. He exhibits normal muscle tone.  Skin: Skin is warm and dry. No rash noted.  Psychiatric: He has a normal mood and affect.  Nursing note and vitals reviewed.   ED Course  Procedures (including critical care time)  Labs Review Labs Reviewed - No data to display  Imaging Review No results found.   Visual Acuity Review  Right Eye Distance:   Left Eye Distance:   Bilateral Distance:    Right Eye Near:   Left Eye Near:    Bilateral Near:         MDM   1. URI (upper respiratory infection)   2. Rhinorrhea    Likely allergic rhinitis but pt refuses to consider this as an etio. Treatment the same Meds ordered this encounter  Medications  . cloNIDine (CATAPRES) 0.2 MG tablet    Sig: Take 0.2 mg by mouth 2 (two) times daily.  . traMADol-acetaminophen (ULTRACET)  37.5-325 MG tablet    Sig: Take 1 tablet by mouth every 6 (six) hours as needed. For headache    Dispense:  15 tablet    Refill:  0    Order Specific Question:  Supervising Provider    Answer:  Linna Hoff [phone]  . Cetirizine HCl 10 MG CAPS    Sig: 1 cap po daily for sneeze and runny nose    Dispense:  24 capsule    Refill:  0    Order Specific Question:  Supervising Provider    Answer:  Linna Hoff [phone]  . ipratropium (ATROVENT) 0.06 % nasal spray    Sig: Place 2 sprays into both nostrils 4 (four) times daily. For runny nose    Dispense:  15 mL    Refill:  12    Order Specific Question:  Supervising Provider    Answer:  Linna Hoff [[id number]]   Drink lots of fluids    Hayden Rasmussen, NP 08/15/15 [phone]

## 2015-08-15 NOTE — ED Notes (Signed)
Here for cold sx onset x2 days... Sx today include sneezing, HA, runny nose, and congestion... Denies fevers.... A&O x4... No acute distress.

## 2015-08-15 NOTE — Discharge Instructions (Signed)

## 2016-02-22 ENCOUNTER — Encounter (HOSPITAL_COMMUNITY): Payer: Self-pay | Admitting: Emergency Medicine

## 2016-02-22 ENCOUNTER — Emergency Department (HOSPITAL_COMMUNITY)
Admission: EM | Admit: 2016-02-22 | Discharge: 2016-02-22 | Disposition: A | Discharge status: Home / Self Care | Payer: Medicare Other | Attending: Emergency Medicine | Admitting: Emergency Medicine

## 2016-02-22 DIAGNOSIS — I10 Essential (primary) hypertension: Secondary | ICD-10-CM | POA: Insufficient documentation

## 2016-02-22 DIAGNOSIS — Z7982 Long term (current) use of aspirin: Secondary | ICD-10-CM | POA: Diagnosis not present

## 2016-02-22 DIAGNOSIS — K649 Unspecified hemorrhoids: Secondary | ICD-10-CM | POA: Diagnosis present

## 2016-02-22 DIAGNOSIS — Z87891 Personal history of nicotine dependence: Secondary | ICD-10-CM | POA: Insufficient documentation

## 2016-02-22 DIAGNOSIS — Z79899 Other long term (current) drug therapy: Secondary | ICD-10-CM | POA: Diagnosis not present

## 2016-02-22 DIAGNOSIS — K645 Perianal venous thrombosis: Secondary | ICD-10-CM | POA: Insufficient documentation

## 2016-02-22 DIAGNOSIS — K629 Disease of anus and rectum, unspecified: Secondary | ICD-10-CM

## 2016-02-22 MED ORDER — LIDOCAINE-EPINEPHRINE (PF) 2 %-1:200000 IJ SOLN
10.0000 mL | Freq: Once | INTRAMUSCULAR | Status: AC
  Administered 2016-02-22: 10 mL
  Filled 2016-02-22: qty 20

## 2016-02-22 NOTE — ED Triage Notes (Signed)
Pt here for hemorrhoids that painful; pt seen at PCP recently for same

## 2016-02-22 NOTE — ED Provider Notes (Signed)
[facility]-EMERGENCY DEPT Provider Note   CSN: [id number] Arrival date & time: 02/22/16  1410 By signing my name below, I, Bridgette HabermannMaria Tan, attest that this documentation has been prepared under the direction and in the presence of Alvira MondayErin Lalisa Kiehn, MD. Electronically Signed: Bridgette HabermannMaria Tan, ED Scribe. 02/22/16. 4:35 PM.  History   Chief Complaint Chief Complaint  Patient presents with  . Hemorrhoids   HPI Comments: Howard Becker is a 65 y.o. male with h/o HTN, who presents to the Emergency Department complaining of a moderate, gradually worsening area of pain and swelling to the rectal area onset one week ago. Pt states pain is exacerbated with palpation and direct pressure, sitting on the area. No alleviating factors noted. Pt has h/o hemorrhoids and notes this feels similar. He went to see his PCP recently for his symptoms because she has previously cut some of his hemorrhoids prior, but she referred him to a specialist. Pt denies fever, chills, nausea, vomiting, abdominal pain, or any other associated symptoms. Denies pain with BM. Reports intermittent bleeding.  The history is provided by the patient. No language interpreter was used.    Past Medical History:  Diagnosis Date  . Depression   . Hypertension     There are no active problems to display for this patient.   Past Surgical History:  Procedure Laterality Date  . BACK SURGERY    . HERNIA REPAIR    . LITHOTRIPSY         Home Medications    Prior to Admission medications   Medication Sig Start Date End Date Taking? Authorizing Provider  ALPRAZolam Prudy Feeler(XANAX) 0.5 MG tablet Take 0.5 mg by mouth at bedtime as needed. For sleep    Historical Provider, MD  Aspirin-Acetaminophen-Caffeine (EXCEDRIN PO) Take 5 tablets by mouth 2 (two) times daily as needed. For pain    Historical Provider, MD  Cetirizine HCl 10 MG CAPS 1 cap po daily for sneeze and runny nose 08/15/15   Hayden Rasmussenavid Mabe, NP  Cholecalciferol (VITAMIN D3) 10000 UNITS capsule Take  10,000 Units by mouth every evening.    Historical Provider, MD  cloNIDine (CATAPRES) 0.2 MG tablet Take 0.2 mg by mouth 2 (two) times daily.    Historical Provider, MD  Cyanocobalamin (VITAMIN B-12 PO) Take 1 tablet by mouth every evening.    Historical Provider, MD  diphenoxylate-atropine (LOMOTIL) 2.5-0.025 MG per tablet Take 1 tablet by mouth 4 (four) times daily as needed for diarrhea or loose stools. 02/01/13   Gilda Creasehristopher J Pollina, MD  ipratropium (ATROVENT) 0.06 % nasal spray Place 2 sprays into both nostrils 4 (four) times daily. For runny nose 08/15/15   Hayden Rasmussenavid Mabe, NP  levETIRAcetam (KEPPRA) 500 MG tablet Take 1 tablet (500 mg total) by mouth every 12 (twelve) hours. 03/26/12   Johnnette GourdErin Boyd, MD  loperamide (IMODIUM) 2 MG capsule Take 1 capsule (2 mg total) by mouth 4 (four) times daily as needed for diarrhea or loose stools. 04/05/15   Gerhard Munchobert Lockwood, MD  Multiple Vitamin (MULITIVITAMIN WITH MINERALS) TABS Take 2 tablets by mouth every evening.     Historical Provider, MD  Omega-3 Fatty Acids (FISH OIL PO) Take 2 capsules by mouth every evening.    Historical Provider, MD  ondansetron (ZOFRAN) 4 MG tablet Take 1 tablet (4 mg total) by mouth every 6 (six) hours. 02/01/13   Gilda Creasehristopher J Pollina, MD  pregabalin (LYRICA) 150 MG capsule Take 150 mg by mouth 2 (two) times daily as needed.     Historical  Provider, MD  traMADol (ULTRAM) 50 MG tablet Take 50 mg by mouth every 12 (twelve) hours as needed for severe pain.     Historical Provider, MD  traMADol-acetaminophen (ULTRACET) 37.5-325 MG tablet Take 1 tablet by mouth every 6 (six) hours as needed. For headache 08/15/15   Hayden Rasmussen, NP    Family History History reviewed. No pertinent family history.  Social History Social History  Substance Use Topics  . Smoking status: Former Games developer  . Smokeless tobacco: Never Used  . Alcohol use No     Allergies   Patient has no known allergies.   Review of Systems Review of Systems    Constitutional: Negative for chills and fever.  Gastrointestinal: Positive for anal bleeding and rectal pain. Negative for abdominal pain, nausea and vomiting.  Skin: Positive for wound.     Physical Exam Updated Vital Signs BP 154/97 (BP Location: Right Arm)   Pulse 91   Temp 97.4 F (36.3 C) (Oral)   Resp 24   Ht 6\' 1"  (1.854 m)   Wt 194 lb (88 kg)   SpO2 100%   BMI 25.60 kg/m   Physical Exam  Constitutional: He appears well-developed and well-nourished.  HENT:  Head: Normocephalic.  Eyes: Conjunctivae are normal.  Cardiovascular: Normal rate.   Pulmonary/Chest: Effort normal. No respiratory distress.  Abdominal: He exhibits no distension.  Genitourinary:  Genitourinary Comments: Chaperone present throughout entire exam. 2 cm tender cystic lesion, slight purple discoloration, 3 mm ulceration/laceration central.  Musculoskeletal: Normal range of motion.  Neurological: He is alert.  Skin: Skin is warm and dry.  Psychiatric: He has a normal mood and affect. His behavior is normal.  Nursing note and vitals reviewed.    ED Treatments / Results  DIAGNOSTIC STUDIES: Oxygen Saturation is 100% on RA, normal by my interpretation.    COORDINATION OF CARE: 4:29 PM Discussed treatment plan with pt at bedside which includes I&D and pt agreed to plan.  Labs (all labs ordered are listed, but only abnormal results are displayed) Labs Reviewed - No data to display  EKG  EKG Interpretation None       Radiology No results found.  Procedures .Marland KitchenIncision and Drainage Date/Time: 02/22/2016 4:47 PM Performed by: Alvira Monday Authorized by: Alvira Monday   Consent:    Consent obtained:  Verbal   Consent given by:  Patient   Risks discussed:  Incomplete drainage, pain, infection, bleeding and damage to other organs Location:    Type:  External thrombosed hemorrhoid   Size:  2 cm   Location:  Lower extremity   Lower extremity location:  Buttock Pre-procedure  details:    Skin preparation:  Antiseptic wash Anesthesia (see MAR for exact dosages):    Anesthesia method:  Local infiltration   Local anesthetic:  Lidocaine 2% WITH epi Procedure type:    Complexity:  Simple Procedure details:    Incision types:  Elliptical   Incision depth:  Dermal   Scalpel blade:  11   Wound management:  Probed and deloculated and extensive cleaning   Drainage:  Bloody   Drainage amount:  Moderate   Wound treatment:  Wound left open   Packing materials:  None Post-procedure details:    Patient tolerance of procedure:  Tolerated well, no immediate complications    Medications Ordered in ED Medications  lidocaine-EPINEPHrine (XYLOCAINE W/EPI) 2 %-1:200000 (PF) injection 10 mL (10 mLs Infiltration Given 02/22/16 1638)     Initial Impression / Assessment and Plan / ED Course  I have reviewed the triage vital signs and the nursing notes.  Pertinent labs & imaging results that were available during my care of the patient were reviewed by me and considered in my medical decision making (see chart for details).  Clinical Course    65 year old male with history of depression, hypertension, back surgery, lithotripsy presents with concern for 1 week of perianal pain.  Lesion is overall most consistent with a thrombosed hemorrhoid, and made elliptical incision with removal of some clot however difficulty obtaining entire clot and lesion is not completely typical of thrombosed hemorrhoid. No sign of cellulitis or abscess.  Patient requesting Percocet for pain, however discussed that for the procedure, I felt local anesthesia was appropriate, and that with lidocaine and drainage I would expect relief of his symptoms. During the procedure, patient had good blanching of skin with lidocaine injection, and appeared comfortable with incisions, and did not indicate pain until deeper exploration, however later reported that he "felt everything" and declined further lidocaine  injection for further exploration.  Offered patient treatment with tramadol, recommend close General Surgery follow up for evaluation, however patient became agitated when I told him I would not prescribe percocet and would recommend tramadol, and other antiinflammatory medications for pain. Attempted to give him paperwork with important follow up information however he refused and walked out of the emergency department without repeat vitals, allowing dressing placement or paperwork.   Final Clinical Impressions(s) / ED Diagnoses   Final diagnoses:  Perianal lesion, most likely external hemorrhoid    New Prescriptions Discharge Medication List as of 02/22/2016  5:13 PM     I personally performed the services described in this documentation, which was scribed in my presence. The recorded information has been reviewed and is accurate.     Alvira MondayErin Avabella Wailes, MD 02/22/16 2210

## 2016-02-22 NOTE — ED Notes (Signed)
Pt refusing to allow staff to complete last set of vitals. RN and NT attempted. MD aware.

## 2016-10-27 ENCOUNTER — Encounter (HOSPITAL_COMMUNITY): Payer: Self-pay | Admitting: Nurse Practitioner

## 2016-10-27 ENCOUNTER — Ambulatory Visit (HOSPITAL_COMMUNITY)
Admission: EM | Admit: 2016-10-27 | Discharge: 2016-10-27 | Disposition: A | Discharge status: Home / Self Care | Payer: Medicare Other | Attending: Family Medicine | Admitting: Family Medicine

## 2016-10-27 DIAGNOSIS — K029 Dental caries, unspecified: Secondary | ICD-10-CM

## 2016-10-27 MED ORDER — HYDROCODONE-ACETAMINOPHEN 5-325 MG PO TABS: 1.0000 | ORAL_TABLET | Freq: Four times a day (QID) | ORAL | 0 refills | Status: DC | PRN

## 2016-10-27 MED ORDER — AMOXICILLIN-POT CLAVULANATE 875-125 MG PO TABS: 1.0000 | ORAL_TABLET | Freq: Two times a day (BID) | ORAL | 0 refills | Status: AC

## 2016-10-27 MED ORDER — AMOXICILLIN-POT CLAVULANATE 875-125 MG PO TABS: 1.0000 | ORAL_TABLET | Freq: Two times a day (BID) | ORAL | 0 refills | Status: DC

## 2016-10-27 NOTE — ED Provider Notes (Signed)
[facility]    CSN: [id number] Arrival date & time: 10/27/16  1033     History   Chief Complaint Chief Complaint  Patient presents with  . Dental Pain    HPI Howard Becker is a 66 y.o. male.   HPI   Patient presenting with concern about dental abcess. One week of dental pain. No fever or chills. Endorse some nausea, no vomiting. Patient states has had dental abscess before previously had a dentist [place], however he retired.  Past Medical History:  Diagnosis Date  . Depression   . Hypertension     There are no active problems to display for this patient.   Past Surgical History:  Procedure Laterality Date  . BACK SURGERY    . HERNIA REPAIR    . LITHOTRIPSY         Home Medications    Prior to Admission medications   Medication Sig Start Date End Date Taking? Authorizing Provider  Multiple Vitamin (MULITIVITAMIN WITH MINERALS) TABS Take 2 tablets by mouth every evening.    Yes [provider]  pregabalin (LYRICA) 150 MG capsule Take 150 mg by mouth 2 (two) times daily as needed.    Yes [provider]  traMADol (ULTRAM) 50 MG tablet Take 50 mg by mouth every 12 (twelve) hours as needed for severe pain.    Yes [provider]  ALPRAZolam Prudy Feeler) 0.5 MG tablet Take 0.5 mg by mouth at bedtime as needed. For sleep    [provider]  amoxicillin-clavulanate (AUGMENTIN) 875-125 MG tablet Take 1 tablet by mouth every 12 (twelve) hours. 10/27/16 11/03/16  Berton Bon, MD  Aspirin-Acetaminophen-Caffeine (EXCEDRIN PO) Take 5 tablets by mouth 2 (two) times daily as needed. For pain    [provider]  Cetirizine HCl 10 MG CAPS 1 cap po daily for sneeze and runny nose 08/15/15   Hayden Rasmussen, NP  Cholecalciferol (VITAMIN D3) 10000 UNITS capsule Take 10,000 Units by mouth every evening.    [provider]  cloNIDine (CATAPRES) 0.2 MG tablet Take 0.2 mg by mouth 2 (two) times daily.    [provider]  Cyanocobalamin (VITAMIN B-12 PO) Take 1 tablet by mouth every evening.    [provider]  diphenoxylate-atropine (LOMOTIL) 2.5-0.025 MG per tablet Take 1 tablet by mouth 4 (four) times daily as needed for diarrhea or loose stools. 02/01/13   Gilda Crease, MD  HYDROcodone-acetaminophen (NORCO) 5-325 MG tablet Take 1 tablet by mouth every 6 (six) hours as needed for moderate pain. 10/27/16   Antonella Upson, Antionette Poles, MD  ipratropium (ATROVENT) 0.06 % nasal spray Place 2 sprays into both nostrils 4 (four) times daily. For runny nose 08/15/15   Hayden Rasmussen, NP  levETIRAcetam (KEPPRA) 500 MG tablet Take 1 tablet (500 mg total) by mouth every 12 (twelve) hours. 03/26/12   Johnnette Gourd, MD  loperamide (IMODIUM) 2 MG capsule Take 1 capsule (2 mg total) by mouth 4 (four) times daily as needed for diarrhea or loose stools. 04/05/15   Gerhard Munch, MD  Omega-3 Fatty Acids (FISH OIL PO) Take 2 capsules by mouth every evening.    [provider]  ondansetron (ZOFRAN) 4 MG tablet Take 1 tablet (4 mg total) by mouth every 6 (six) hours. 02/01/13   Gilda Crease, MD  traMADol-acetaminophen (ULTRACET) 37.5-325 MG tablet Take 1 tablet by mouth every 6 (six) hours as needed. For headache 08/15/15   Hayden Rasmussen, NP    Family  History History reviewed. No pertinent family history.  Social History Social History  Substance Use Topics  . Smoking status: Former Games developer  . Smokeless tobacco: Never Used  . Alcohol use No     Allergies   Patient has no known allergies.   Review of Systems Review of Systems  Constitutional: Negative for chills and fever.  HENT: Positive for dental problem. Negative for facial swelling.      Physical Exam Triage Vital Signs ED Triage Vitals  Enc Vitals Group     BP 10/27/16 1130 110/68     Pulse Rate 10/27/16 1130 95     Resp 10/27/16 1130 16     Temp 10/27/16 1130 98.1 F (36.7 C)     Temp Source 10/27/16 1130 Oral     SpO2  10/27/16 1130 95 %     Weight --      Height --      Head Circumference --      Peak Flow --      Pain Score 10/27/16 1133 7     Pain Loc --      Pain Edu? --      Excl. in GC? --    No data found.   Updated Vital Signs BP 110/68   Pulse 95   Temp 98.1 F (36.7 C) (Oral)   Resp 16   SpO2 95%   Physical Exam  Constitutional: He appears well-developed and well-nourished.  HENT:  Head: Normocephalic and atraumatic.  Dental abscess noted on right upper molar, no lymphadenopathy noted  Eyes: Conjunctivae are normal.  Neck: Normal range of motion. Neck supple.  Cardiovascular: Normal rate.   Pulmonary/Chest: Effort normal and breath sounds normal.  Lymphadenopathy:    He has no cervical adenopathy.     UC Treatments / Results  Labs (all labs ordered are listed, but only abnormal results are displayed) Labs Reviewed - No data to display  EKG  EKG Interpretation None       Radiology No results found.  Procedures Procedures (including critical care time)  Medications Ordered in UC Medications - No data to display   Initial Impression / Assessment and Plan / UC Course  I have reviewed the triage vital signs and the nursing notes.  Pertinent labs & imaging results that were available during my care of the patient were reviewed by me and considered in my medical decision making (see chart for details).   Dental abscess treat with antibiotics. Provided pain management and resources  Final Clinical Impressions(s) / UC Diagnoses   Final diagnoses:  Dental caries    New Prescriptions Discharge Medication List as of 10/27/2016 12:04 PM    START taking these medications   Details  HYDROcodone-acetaminophen (NORCO) 5-325 MG tablet Take 1 tablet by mouth every 6 (six) hours as needed for moderate pain., Starting Wed 10/27/2016, Print         Controlled Substance Prescriptions [place] Controlled Substance Registry consulted? Yes, I have consulted the [place]  Controlled Substances Registry for this patient, and feel the risk/benefit ratio today is favorable for proceeding with this prescription for a controlled substance.   Berton Bon, MD 10/27/16 [phone]

## 2016-10-27 NOTE — ED Triage Notes (Signed)
Pt presents with c/o dental pain. The pain began in his right upper mouth about one week ago. Pain has been increasingly worse since onset and is now radiating into his face and head. He does not have dental insurance

## 2016-10-27 NOTE — Discharge Instructions (Signed)
Please follow-up with a dentist as soon as possible. I have given you 7 days of Augmentin to help with the infection. However you need to get these teeth taken out. I've also given you a couple days of pain control with Vicodin. You can gargle with saltwater 2-4 times daily to help with the pain as well.

## 2016-11-01 ENCOUNTER — Emergency Department (HOSPITAL_COMMUNITY)
Admission: EM | Admit: 2016-11-01 | Discharge: 2016-11-01 | Disposition: A | Discharge status: Home / Self Care | Payer: Medicare Other | Attending: Emergency Medicine | Admitting: Emergency Medicine

## 2016-11-01 ENCOUNTER — Encounter (HOSPITAL_COMMUNITY): Payer: Self-pay | Admitting: Emergency Medicine

## 2016-11-01 DIAGNOSIS — Z79899 Other long term (current) drug therapy: Secondary | ICD-10-CM | POA: Insufficient documentation

## 2016-11-01 DIAGNOSIS — K029 Dental caries, unspecified: Secondary | ICD-10-CM | POA: Insufficient documentation

## 2016-11-01 DIAGNOSIS — Z87891 Personal history of nicotine dependence: Secondary | ICD-10-CM | POA: Diagnosis not present

## 2016-11-01 DIAGNOSIS — I10 Essential (primary) hypertension: Secondary | ICD-10-CM | POA: Diagnosis not present

## 2016-11-01 NOTE — ED Provider Notes (Signed)
[facility]   CSN: [id number] Arrival date & time: 11/01/16  1223     History   Chief Complaint Chief Complaint  Patient presents with  . Dental Pain    HPI Howard Becker is a 66 y.o. male.  HPI 66 year old man presented today complaining of dental pain. He states that he has had pain in his right upper jaw at the site of a bad tooth for several weeks. He was seen at the urgent care week ago and has been taking antibiotics. He states thetoes to the operative cannot afford a dentist. He has not noted any increased swelling, fever, chills, difficulty breathing, swallowing, or speaking. Past Medical History:  Diagnosis Date  . Depression   . Hypertension     There are no active problems to display for this patient.   Past Surgical History:  Procedure Laterality Date  . BACK SURGERY    . HERNIA REPAIR    . LITHOTRIPSY         Home Medications    Prior to Admission medications   Medication Sig Start Date End Date Taking? Authorizing Provider  ALPRAZolam Prudy Feeler) 0.5 MG tablet Take 0.5 mg by mouth at bedtime as needed. For sleep    [provider]  amoxicillin-clavulanate (AUGMENTIN) 875-125 MG tablet Take 1 tablet by mouth every 12 (twelve) hours. 10/27/16 11/03/16  Berton Bon, MD  Aspirin-Acetaminophen-Caffeine (EXCEDRIN PO) Take 5 tablets by mouth 2 (two) times daily as needed. For pain    [provider]  Cetirizine HCl 10 MG CAPS 1 cap po daily for sneeze and runny nose 08/15/15   Hayden Rasmussen, NP  Cholecalciferol (VITAMIN D3) 10000 UNITS capsule Take 10,000 Units by mouth every evening.    [provider]  cloNIDine (CATAPRES) 0.2 MG tablet Take 0.2 mg by mouth 2 (two) times daily.    [provider]  Cyanocobalamin (VITAMIN B-12 PO) Take 1 tablet by mouth every evening.    [provider]  diphenoxylate-atropine (LOMOTIL) 2.5-0.025 MG per tablet Take 1 tablet by mouth 4 (four) times daily as  needed for diarrhea or loose stools. 02/01/13   Gilda Crease, MD  HYDROcodone-acetaminophen (NORCO) 5-325 MG tablet Take 1 tablet by mouth every 6 (six) hours as needed for moderate pain. 10/27/16   Mikell, Antionette Poles, MD  ipratropium (ATROVENT) 0.06 % nasal spray Place 2 sprays into both nostrils 4 (four) times daily. For runny nose 08/15/15   Hayden Rasmussen, NP  levETIRAcetam (KEPPRA) 500 MG tablet Take 1 tablet (500 mg total) by mouth every 12 (twelve) hours. 03/26/12   Johnnette Gourd, MD  loperamide (IMODIUM) 2 MG capsule Take 1 capsule (2 mg total) by mouth 4 (four) times daily as needed for diarrhea or loose stools. 04/05/15   Gerhard Munch, MD  Multiple Vitamin (MULITIVITAMIN WITH MINERALS) TABS Take 2 tablets by mouth every evening.     [provider]  Omega-3 Fatty Acids (FISH OIL PO) Take 2 capsules by mouth every evening.    [provider]  ondansetron (ZOFRAN) 4 MG tablet Take 1 tablet (4 mg total) by mouth every 6 (six) hours. 02/01/13   Gilda Crease, MD  pregabalin (LYRICA) 150 MG capsule Take 150 mg by mouth 2 (two) times daily as needed.     [provider]  traMADol (ULTRAM) 50 MG tablet Take 50 mg by mouth every 12 (twelve) hours as needed for severe pain.     [provider]  traMADol-acetaminophen (ULTRACET) 37.5-325 MG tablet Take 1 tablet by mouth every 6 (six) hours as needed. For headache 08/15/15   Hayden Rasmussen, NP    Family History History reviewed. No pertinent family history.  Social History Social History  Substance Use Topics  . Smoking status: Former Games developer  . Smokeless tobacco: Never Used  . Alcohol use No     Allergies   Patient has no known allergies.   Review of Systems Review of Systems  All other systems reviewed and are negative.    Physical Exam Updated Vital Signs BP 110/74 (BP Location: Left Arm)   Pulse 84   Temp 98.1 F (36.7 C) (Oral)   Resp 16   SpO2 94%   Physical Exam    Constitutional: He is oriented to person, place, and time. He appears well-developed and well-nourished. No distress.  HENT:  Head: Normocephalic and atraumatic.  Right Ear: External ear normal.  Left Ear: External ear normal.  Mouth/Throat: Oropharynx is clear and moist.  Diffuse poor dentition with no swelling or discharge noted. Most native teeth have caries down to the gumline and are had previous fractures due to this. Tooth number 14 is where his current pain is- no swelling, or fluctuance noted.   Neck: Normal range of motion.  Cardiovascular: Normal rate.   Pulmonary/Chest: Effort normal.  Musculoskeletal: Normal range of motion.  Lymphadenopathy:    He has no cervical adenopathy.  Neurological: He is alert and oriented to person, place, and time.  Skin: Skin is warm. Capillary refill takes less than 2 seconds.  Psychiatric: He has a normal mood and affect.  Vitals reviewed.    ED Treatments / Results  Labs (all labs ordered are listed, but only abnormal results are displayed) Labs Reviewed - No data to display  EKG  EKG Interpretation None       Radiology No results found.  Procedures Procedures (including critical care time)  Medications Ordered in ED Medications - No data to display   Initial Impression / Assessment and Plan / ED Course  I have reviewed the triage vital signs and the nursing notes.  Pertinent labs & imaging results that were available during my care of the patient were reviewed by me and considered in my medical decision making (see chart for details).   patient is currently on antibiotics. I discussed the need for follow-up with dentist. He voices his difficulty accessing due to cost. He is referred to Dr. Barbette Merino for oral surgery for likely need for dental extractions. Patient advised regarding dental hygiene and OTC pain management as well as return precautions. He is given Design[employer], jewellery.  Final Clinical Impressions(s) / ED  Diagnoses   Final diagnoses:  Pain due to dental caries    New Prescriptions New Prescriptions   No medications on file     Margarita Grizzle, MD 11/01/16 1349

## 2016-11-01 NOTE — ED Notes (Signed)
See EDP secondary assessment.  

## 2016-11-01 NOTE — ED Notes (Signed)
Pt requesting pain medication, per edp since he is driving home we can give him ibuprofen, ibuprofen offered to pt and he refused.  Instructed to the patient we could get him something stronger if someone could come get him. Pt states his wife can come get him but he does not want to take the pain medication here he would like for [place] to give it to him for him to take home. Per edp he needs to see a dentist for pain control and to treat his teeth. Pt verbalized understanding and offered a dose here. Pt refused.

## 2016-11-01 NOTE — ED Triage Notes (Signed)
Pt to ER for evaluation of dental pain to right upper mouth onset "a while ago." states was seen last week and given antibiotics, has finished the course but the pain is still there. No swelling noted.

## 2017-04-22 ENCOUNTER — Encounter (HOSPITAL_COMMUNITY): Payer: Self-pay | Admitting: Emergency Medicine

## 2017-04-22 ENCOUNTER — Other Ambulatory Visit: Payer: Self-pay

## 2017-04-22 ENCOUNTER — Emergency Department (HOSPITAL_COMMUNITY): Payer: Medicare Other

## 2017-04-22 ENCOUNTER — Inpatient Hospital Stay (HOSPITAL_COMMUNITY)
Admission: EM | Admit: 2017-04-22 | Discharge: 2017-04-26 | DRG: 683 | Disposition: A | Discharge status: Home / Self Care | Payer: Medicare Other | Attending: Internal Medicine | Admitting: Internal Medicine

## 2017-04-22 DIAGNOSIS — R0789 Other chest pain: Secondary | ICD-10-CM | POA: Diagnosis present

## 2017-04-22 DIAGNOSIS — Z79899 Other long term (current) drug therapy: Secondary | ICD-10-CM

## 2017-04-22 DIAGNOSIS — R079 Chest pain, unspecified: Secondary | ICD-10-CM

## 2017-04-22 DIAGNOSIS — R072 Precordial pain: Secondary | ICD-10-CM | POA: Diagnosis not present

## 2017-04-22 DIAGNOSIS — R748 Abnormal levels of other serum enzymes: Secondary | ICD-10-CM | POA: Diagnosis present

## 2017-04-22 DIAGNOSIS — I10 Essential (primary) hypertension: Secondary | ICD-10-CM | POA: Diagnosis present

## 2017-04-22 DIAGNOSIS — Z87891 Personal history of nicotine dependence: Secondary | ICD-10-CM

## 2017-04-22 DIAGNOSIS — Z79891 Long term (current) use of opiate analgesic: Secondary | ICD-10-CM

## 2017-04-22 DIAGNOSIS — N179 Acute kidney failure, unspecified: Principal | ICD-10-CM | POA: Diagnosis present

## 2017-04-22 DIAGNOSIS — E871 Hypo-osmolality and hyponatremia: Secondary | ICD-10-CM

## 2017-04-22 DIAGNOSIS — F1010 Alcohol abuse, uncomplicated: Secondary | ICD-10-CM | POA: Diagnosis present

## 2017-04-22 DIAGNOSIS — G8929 Other chronic pain: Secondary | ICD-10-CM | POA: Diagnosis present

## 2017-04-22 HISTORY — DX: Abnormal levels of other serum enzymes: R74.8

## 2017-04-22 HISTORY — DX: Chest pain, unspecified: R07.9

## 2017-04-22 HISTORY — DX: Alcohol abuse, uncomplicated: F10.10

## 2017-04-22 HISTORY — DX: Hypo-osmolality and hyponatremia: E87.1

## 2017-04-22 HISTORY — DX: Acute kidney failure, unspecified: N17.9

## 2017-04-22 LAB — CBC
HCT: 40.3 % (ref 39.0–52.0)
HCT: 37.8 % — ABNORMAL LOW (ref 39.0–52.0)
Hemoglobin: 14.5 g/dL (ref 13.0–17.0)
Hemoglobin: 13.6 g/dL (ref 13.0–17.0)
MCH: 31 pg (ref 26.0–34.0)
MCH: 30.6 pg (ref 26.0–34.0)
MCHC: 36 g/dL (ref 30.0–36.0)
MCHC: 36 g/dL (ref 30.0–36.0)
MCV: 86.1 fL (ref 78.0–100.0)
MCV: 84.9 fL (ref 78.0–100.0)
PLATELETS: 281 10*3/uL (ref 150–400)
Platelets: 260 10*3/uL (ref 150–400)
RBC: 4.68 MIL/uL (ref 4.22–5.81)
RBC: 4.45 MIL/uL (ref 4.22–5.81)
RDW: 13.4 % (ref 11.5–15.5)
RDW: 13.3 % (ref 11.5–15.5)
WBC: 8.6 10*3/uL (ref 4.0–10.5)
WBC: 9 10*3/uL (ref 4.0–10.5)

## 2017-04-22 LAB — BASIC METABOLIC PANEL
Anion gap: 13 (ref 5–15)
Anion gap: 11 (ref 5–15)
Anion gap: 9 (ref 5–15)
BUN: 28 mg/dL — AB (ref 6–20)
BUN: 26 mg/dL — ABNORMAL HIGH (ref 6–20)
BUN: 26 mg/dL — AB (ref 6–20)
CALCIUM: 9.4 mg/dL (ref 8.9–10.3)
CHLORIDE: 100 mmol/L — AB (ref 101–111)
CHLORIDE: 101 mmol/L (ref 101–111)
CO2: 16 mmol/L — ABNORMAL LOW (ref 22–32)
CO2: 15 mmol/L — AB (ref 22–32)
CO2: 17 mmol/L — ABNORMAL LOW (ref 22–32)
Calcium: 8.9 mg/dL (ref 8.9–10.3)
Calcium: 8.9 mg/dL (ref 8.9–10.3)
Chloride: 94 mmol/L — ABNORMAL LOW (ref 101–111)
Creatinine, Ser: 2.68 mg/dL — ABNORMAL HIGH (ref 0.61–1.24)
Creatinine, Ser: 2.48 mg/dL — ABNORMAL HIGH (ref 0.61–1.24)
Creatinine, Ser: 2.36 mg/dL — ABNORMAL HIGH (ref 0.61–1.24)
GFR calc Af Amer: 27 mL/min — ABNORMAL LOW (ref >60)
GFR calc Af Amer: 29 mL/min — ABNORMAL LOW (ref >60)
GFR calc Af Amer: 31 mL/min — ABNORMAL LOW (ref >60)
GFR calc non Af Amer: 23 mL/min — ABNORMAL LOW (ref >60)
GFR calc non Af Amer: 25 mL/min — ABNORMAL LOW (ref >60)
GFR calc non Af Amer: 27 mL/min — ABNORMAL LOW (ref >60)
GLUCOSE: 106 mg/dL — AB (ref 65–99)
Glucose, Bld: 110 mg/dL — ABNORMAL HIGH (ref 65–99)
Glucose, Bld: 111 mg/dL — ABNORMAL HIGH (ref 65–99)
POTASSIUM: 4.9 mmol/L (ref 3.5–5.1)
POTASSIUM: 4.8 mmol/L (ref 3.5–5.1)
Potassium: 4.6 mmol/L (ref 3.5–5.1)
SODIUM: 126 mmol/L — AB (ref 135–145)
SODIUM: 127 mmol/L — AB (ref 135–145)
Sodium: 123 mmol/L — ABNORMAL LOW (ref 135–145)

## 2017-04-22 LAB — SODIUM, URINE, RANDOM: Sodium, Ur: 56 mmol/L

## 2017-04-22 LAB — HEPATIC FUNCTION PANEL
ALBUMIN: 4 g/dL (ref 3.5–5.0)
ALT: 40 U/L (ref 17–63)
AST: 68 U/L — AB (ref 15–41)
Alkaline Phosphatase: 76 U/L (ref 38–126)
Bilirubin, Direct: 0.2 mg/dL (ref 0.1–0.5)
Indirect Bilirubin: 1 mg/dL — ABNORMAL HIGH (ref 0.3–0.9)
Total Bilirubin: 1.2 mg/dL (ref 0.3–1.2)
Total Protein: 7.3 g/dL (ref 6.5–8.1)

## 2017-04-22 LAB — LIPASE, BLOOD: LIPASE: 70 U/L — AB (ref 11–51)

## 2017-04-22 LAB — CREATININE, SERUM
Creatinine, Ser: 2.53 mg/dL — ABNORMAL HIGH (ref 0.61–1.24)
GFR calc Af Amer: 29 mL/min — ABNORMAL LOW (ref >60)
GFR calc non Af Amer: 25 mL/min — ABNORMAL LOW (ref >60)

## 2017-04-22 LAB — RAPID URINE DRUG SCREEN, HOSP PERFORMED
Amphetamines: NOT DETECTED
BARBITURATES: NOT DETECTED
Benzodiazepines: NOT DETECTED
Cocaine: NOT DETECTED
Opiates: NOT DETECTED
Tetrahydrocannabinol: NOT DETECTED

## 2017-04-22 LAB — LIPID PANEL
CHOLESTEROL: 137 mg/dL (ref 0–200)
HDL: 61 mg/dL (ref >40)
LDL Cholesterol: 52 mg/dL (ref 0–99)
TRIGLYCERIDES: 122 mg/dL (ref <150)
Total CHOL/HDL Ratio: 2.2 RATIO
VLDL: 24 mg/dL (ref 0–40)

## 2017-04-22 LAB — I-STAT TROPONIN, ED: TROPONIN I, POC: 0.01 ng/mL (ref 0.00–0.08)

## 2017-04-22 LAB — TROPONIN I
Troponin I: <0.03 ng/mL (ref <0.03)
Troponin I: <0.03 ng/mL (ref <0.03)

## 2017-04-22 LAB — OSMOLALITY: Osmolality: 270 mOsm/kg — ABNORMAL LOW (ref 275–295)

## 2017-04-22 LAB — ETHANOL: Alcohol, Ethyl (B): <10 mg/dL (ref <10)

## 2017-04-22 LAB — OSMOLALITY, URINE: Osmolality, Ur: 208 mosm/kg — ABNORMAL LOW (ref 300–900)

## 2017-04-22 MED ORDER — HYDROCODONE-ACETAMINOPHEN 5-325 MG PO TABS
1.0000 | ORAL_TABLET | Freq: Four times a day (QID) | ORAL | Status: DC | PRN
  Administered 2017-04-23 – 2017-04-26 (×9): 1 via ORAL
  Filled 2017-04-22 (×9): qty 1

## 2017-04-22 MED ORDER — GI COCKTAIL ~~LOC~~
30.0000 mL | Freq: Once | ORAL | Status: AC
  Administered 2017-04-22: 30 mL via ORAL
  Filled 2017-04-22: qty 30

## 2017-04-22 MED ORDER — IPRATROPIUM BROMIDE 0.06 % NA SOLN
2.0000 | Freq: Four times a day (QID) | NASAL | Status: DC
  Administered 2017-04-22 – 2017-04-25 (×10): 2 via NASAL
  Filled 2017-04-22: qty 15

## 2017-04-22 MED ORDER — ONDANSETRON HCL 4 MG/2ML IJ SOLN: 4.0000 mg | Freq: Four times a day (QID) | INTRAMUSCULAR | Status: DC | PRN

## 2017-04-22 MED ORDER — PANTOPRAZOLE SODIUM 40 MG IV SOLR
40.0000 mg | INTRAVENOUS | Status: DC
  Administered 2017-04-23 – 2017-04-24 (×2): 40 mg via INTRAVENOUS
  Filled 2017-04-22 (×2): qty 40

## 2017-04-22 MED ORDER — ADULT MULTIVITAMIN W/MINERALS CH
1.0000 | ORAL_TABLET | Freq: Every day | ORAL | Status: DC
  Administered 2017-04-22 – 2017-04-26 (×5): 1 via ORAL
  Filled 2017-04-22 (×5): qty 1

## 2017-04-22 MED ORDER — HEPARIN SODIUM (PORCINE) 5000 UNIT/ML IJ SOLN
5000.0000 [IU] | Freq: Three times a day (TID) | INTRAMUSCULAR | Status: DC
  Administered 2017-04-22 – 2017-04-26 (×11): 5000 [IU] via SUBCUTANEOUS
  Filled 2017-04-22 (×11): qty 1

## 2017-04-22 MED ORDER — SODIUM CHLORIDE 0.9 % IV BOLUS (SEPSIS)
1000.0000 mL | Freq: Once | INTRAVENOUS | Status: AC
  Administered 2017-04-22: 1000 mL via INTRAVENOUS

## 2017-04-22 MED ORDER — GI COCKTAIL ~~LOC~~: 30.0000 mL | Freq: Four times a day (QID) | ORAL | Status: DC | PRN

## 2017-04-22 MED ORDER — ASPIRIN EC 325 MG PO TBEC: 325.0000 mg | DELAYED_RELEASE_TABLET | Freq: Every day | ORAL | Status: DC

## 2017-04-22 MED ORDER — ASPIRIN EC 325 MG PO TBEC
325.0000 mg | DELAYED_RELEASE_TABLET | Freq: Every day | ORAL | Status: DC
  Administered 2017-04-22 – 2017-04-26 (×5): 325 mg via ORAL
  Filled 2017-04-22 (×5): qty 1

## 2017-04-22 MED ORDER — SODIUM CHLORIDE 0.9 % IV SOLN: INTRAVENOUS | Status: DC

## 2017-04-22 MED ORDER — MORPHINE SULFATE (PF) 4 MG/ML IV SOLN: 2.0000 mg | INTRAVENOUS | Status: DC | PRN

## 2017-04-22 MED ORDER — LORAZEPAM 1 MG PO TABS
1.0000 mg | ORAL_TABLET | Freq: Four times a day (QID) | ORAL | Status: DC | PRN
  Administered 2017-04-22 – 2017-04-24 (×3): 1 mg via ORAL
  Filled 2017-04-22 (×3): qty 1

## 2017-04-22 MED ORDER — CLONIDINE HCL 0.2 MG PO TABS
0.2000 mg | ORAL_TABLET | Freq: Two times a day (BID) | ORAL | Status: DC
  Administered 2017-04-22 – 2017-04-25 (×4): 0.2 mg via ORAL
  Filled 2017-04-22 (×7): qty 1

## 2017-04-22 MED ORDER — PANTOPRAZOLE SODIUM 40 MG IV SOLR
40.0000 mg | Freq: Once | INTRAVENOUS | Status: AC
  Administered 2017-04-22: 40 mg via INTRAVENOUS
  Filled 2017-04-22: qty 40

## 2017-04-22 MED ORDER — LORAZEPAM 2 MG/ML IJ SOLN: 1.0000 mg | Freq: Four times a day (QID) | INTRAMUSCULAR | Status: DC | PRN

## 2017-04-22 MED ORDER — THIAMINE HCL 100 MG/ML IJ SOLN: 100.0000 mg | Freq: Every day | INTRAMUSCULAR | Status: DC

## 2017-04-22 MED ORDER — VITAMIN B-1 100 MG PO TABS
100.0000 mg | ORAL_TABLET | Freq: Every day | ORAL | Status: DC
  Administered 2017-04-22 – 2017-04-26 (×5): 100 mg via ORAL
  Filled 2017-04-22 (×5): qty 1

## 2017-04-22 MED ORDER — FOLIC ACID 1 MG PO TABS
1.0000 mg | ORAL_TABLET | Freq: Every day | ORAL | Status: DC
Start: 2017-04-22 — End: 2017-04-26
  Administered 2017-04-22 – 2017-04-26 (×5): 1 mg via ORAL
  Filled 2017-04-22 (×5): qty 1

## 2017-04-22 MED ORDER — ACETAMINOPHEN 325 MG PO TABS
650.0000 mg | ORAL_TABLET | ORAL | Status: DC | PRN
  Administered 2017-04-22: 650 mg via ORAL
  Filled 2017-04-22 (×2): qty 2

## 2017-04-22 MED ORDER — LEVETIRACETAM 500 MG PO TABS
500.0000 mg | ORAL_TABLET | Freq: Two times a day (BID) | ORAL | Status: DC
  Administered 2017-04-22 – 2017-04-23 (×3): 500 mg via ORAL
  Filled 2017-04-22 (×3): qty 1

## 2017-04-22 NOTE — ED Triage Notes (Signed)
Patient with chest pain that started over one week ago.  Patient states that he did not have any money to go to his PCP.  Patient does have hypertension.  Patient with some shortness of breath, which he states gets worse when he runs out of his medication.  He ran out of his HTN meds yesterday.

## 2017-04-22 NOTE — ED Notes (Signed)
Food tray given to patient 

## 2017-04-22 NOTE — ED Notes (Signed)
Pt wife at bedside and states "My husband will not tell you what the real problem is, but I want to. He has a problem with drugs. He takes them all the time, and i'll tell you what, he's here because he ran out. He takes tramadol and lyrica, and he will take 20 of each every day. He even tries to take my medications. He made me promise not to tell anyone, but I have to, I just have to. When he runs out of his meds, he supplements them with beer". MD made aware, requested this RN put in social work consult.

## 2017-04-22 NOTE — ED Provider Notes (Signed)
[facility] EMERGENCY DEPARTMENT Provider Note   CSN: [id number] Arrival date & time: 04/22/17  0457     History   Chief Complaint Chief Complaint  Patient presents with  . Chest Pain    HPI Howard Becker is a 67 y.o. male.  Patient with history of hypertension --presents with complaint of left-sided chest pain ongoing for approximately 1 week.  States that his pain has been getting worse.  Pain does not radiate.  It is not associated with vomiting, exertion, diaphoresis.  Patient states that his pain feels somewhat better when he has his blood pressure medicines but he ran out this morning.  Denies fever or cough.  He has had some nausea.  States that he has had some shortness of breath but this is mild.  He reports drinking alcohol to help make the pain better.  Typically he drinks about 4 beers per day but this morning states that he drank 8 to help him get out of pain. Denies drinking regularly prior to chest pain starting. Patient denies risk factors for pulmonary embolism including: unilateral leg swelling, history of DVT/PE/other blood clots, use of exogenous hormones, recent immobilizations, recent surgery, recent travel (>4hr segment), malignancy, hemoptysis.       Past Medical History:  Diagnosis Date  . Depression   . Hypertension     There are no active problems to display for this patient.   Past Surgical History:  Procedure Laterality Date  . BACK SURGERY    . HERNIA REPAIR    . LITHOTRIPSY         Home Medications    Prior to Admission medications   Medication Sig Start Date End Date Taking? Authorizing Provider  ALPRAZolam Prudy Feeler) 0.5 MG tablet Take 0.5 mg by mouth at bedtime as needed. For sleep    [provider]  Aspirin-Acetaminophen-Caffeine (EXCEDRIN PO) Take 5 tablets by mouth 2 (two) times daily as needed. For pain    [provider]  Cetirizine HCl 10 MG CAPS 1 cap po daily for sneeze and runny nose 08/15/15    Hayden Rasmussen, NP  Cholecalciferol (VITAMIN D3) 10000 UNITS capsule Take 10,000 Units by mouth every evening.    [provider]  cloNIDine (CATAPRES) 0.2 MG tablet Take 0.2 mg by mouth 2 (two) times daily.    [provider]  Cyanocobalamin (VITAMIN B-12 PO) Take 1 tablet by mouth every evening.    [provider]  diphenoxylate-atropine (LOMOTIL) 2.5-0.025 MG per tablet Take 1 tablet by mouth 4 (four) times daily as needed for diarrhea or loose stools. 02/01/13   Gilda Crease, MD  HYDROcodone-acetaminophen (NORCO) 5-325 MG tablet Take 1 tablet by mouth every 6 (six) hours as needed for moderate pain. 10/27/16   Mikell, Antionette Poles, MD  ipratropium (ATROVENT) 0.06 % nasal spray Place 2 sprays into both nostrils 4 (four) times daily. For runny nose 08/15/15   Hayden Rasmussen, NP  levETIRAcetam (KEPPRA) 500 MG tablet Take 1 tablet (500 mg total) by mouth every 12 (twelve) hours. 03/26/12   Johnnette Gourd, MD  loperamide (IMODIUM) 2 MG capsule Take 1 capsule (2 mg total) by mouth 4 (four) times daily as needed for diarrhea or loose stools. 04/05/15   Gerhard Munch, MD  Multiple Vitamin (MULITIVITAMIN WITH MINERALS) TABS Take 2 tablets by mouth every evening.     [provider]  Omega-3 Fatty Acids (FISH OIL PO) Take 2 capsules by mouth every evening.    [provider]  ondansetron (ZOFRAN) 4 MG tablet Take 1 tablet (4 mg total) by mouth every 6 (six) hours. 02/01/13   Gilda Crease, MD  pregabalin (LYRICA) 150 MG capsule Take 150 mg by mouth 2 (two) times daily as needed.     [provider]  traMADol (ULTRAM) 50 MG tablet Take 50 mg by mouth every 12 (twelve) hours as needed for severe pain.     [provider]  traMADol-acetaminophen (ULTRACET) 37.5-325 MG tablet Take 1 tablet by mouth every 6 (six) hours as needed. For headache 08/15/15   Hayden Rasmussen, NP    Family History No family history on file.  Social History Social  History   Tobacco Use  . Smoking status: Former Games developer  . Smokeless tobacco: Never Used  Substance Use Topics  . Alcohol use: No  . Drug use: No     Allergies   Patient has no known allergies.   Review of Systems Review of Systems  Constitutional: Negative for diaphoresis and fever.  Eyes: Negative for redness.  Respiratory: Positive for shortness of breath. Negative for cough.   Cardiovascular: Positive for chest pain. Negative for palpitations and leg swelling.  Gastrointestinal: Positive for nausea. Negative for abdominal pain and vomiting.  Genitourinary: Negative for dysuria.  Musculoskeletal: Negative for back pain and neck pain.  Skin: Negative for rash.  Neurological: Negative for syncope and light-headedness.  Psychiatric/Behavioral: The patient is not nervous/anxious.      Physical Exam Updated Vital Signs BP 131/72 (BP Location: Right Arm)   Pulse 76   Temp 97.6 F (36.4 C) (Oral)   Resp 16   SpO2 99%   Physical Exam  Constitutional: He appears well-developed and well-nourished.  HENT:  Head: Normocephalic and atraumatic.  Mouth/Throat: Mucous membranes are normal. Mucous membranes are not dry.  Eyes: Conjunctivae are normal.  Neck: Trachea normal and normal range of motion. Neck supple. Normal carotid pulses and no JVD present. No muscular tenderness present. Carotid bruit is not present. No tracheal deviation present.  Cardiovascular: Normal rate, regular rhythm, S1 normal, S2 normal, normal heart sounds and intact distal pulses. Exam reveals no distant heart sounds and no decreased pulses.  No murmur heard. Pulses:      Radial pulses are 2+ on the right side, and 2+ on the left side.  Pulmonary/Chest: Effort normal and breath sounds normal. No respiratory distress. He has no wheezes. He exhibits no tenderness.  Abdominal: Soft. Normal aorta and bowel sounds are normal. There is no tenderness. There is no rebound and no guarding.  Musculoskeletal: He  exhibits no edema.  Neurological: He is alert.  Skin: Skin is warm and dry. He is not diaphoretic. No cyanosis. No pallor.  Psychiatric: He has a normal mood and affect.  Nursing note and vitals reviewed.    ED Treatments / Results  Labs (all labs ordered are listed, but only abnormal results are displayed) Labs Reviewed  BASIC METABOLIC PANEL - Abnormal; Notable for the following components:      Result Value   Sodium 123 (*)    Chloride 94 (*)    CO2 16 (*)    Glucose, Bld 106 (*)    BUN 28 (*)    Creatinine, Ser 2.68 (*)    GFR calc non Af Amer 23 (*)    GFR calc Af Amer 27 (*)    All other components within normal limits  LIPASE, BLOOD - Abnormal; Notable for the following components:   Lipase  70 (*)    All other components within normal limits  HEPATIC FUNCTION PANEL - Abnormal; Notable for the following components:   AST 68 (*)    Indirect Bilirubin 1.0 (*)    All other components within normal limits  CBC  ETHANOL  RAPID URINE DRUG SCREEN, HOSP PERFORMED  I-STAT TROPONIN, ED    EKG  EKG Interpretation  Date/Time:  Friday April 22 2017 05:05:08 EST Ventricular Rate:  96 PR Interval:  140 QRS Duration: 92 QT Interval:  328 QTC Calculation: 414 R Axis:   77 Text Interpretation:  Normal sinus rhythm Minimal voltage criteria for LVH, may be normal variant Borderline ECG No significant change since last tracing Confirmed by Rochele RaringWard, Kristen [phone]([id number]) on 04/22/2017 5:09:03 AM       Radiology Dg Chest 2 View  Result Date: 04/22/2017 CLINICAL DATA:  Chest pain. EXAM: CHEST  2 VIEW COMPARISON:  Radiographs 03/26/2012 FINDINGS: Chronic hyperinflation and interstitial coarsening. Unchanged heart size and mediastinal contours. No consolidation. No pulmonary edema, pleural effusion or pneumothorax. No acute osseous abnormalities. IMPRESSION: Chronic hyperinflation and interstitial thickening. No acute abnormality. Electronically Signed   By: Rubye OaksMelanie  Ehinger M.D.   On:  04/22/2017 05:40    Procedures Procedures (including critical care time)  Medications Ordered in ED Medications  sodium chloride 0.9 % bolus 1,000 mL (0 mLs Intravenous Stopped 04/22/17 0726)  gi cocktail (Maalox,Lidocaine,Donnatal) (30 mLs Oral Given 04/22/17 0629)  pantoprazole (PROTONIX) injection 40 mg (40 mg Intravenous Given 04/22/17 0630)     Initial Impression / Assessment and Plan / ED Course  I have reviewed the triage vital signs and the nursing notes.  Pertinent labs & imaging results that were available during my care of the patient were reviewed by me and considered in my medical decision making (see chart for details).     Patient seen and examined. Work-up initiated. Medications ordered. Will need admit for AKI, hyponatremia. EKG reviewed. No significant change from 08/2012.   Vital signs reviewed and are as follows: BP 131/72 (BP Location: Right Arm)   Pulse 76   Temp 97.6 F (36.4 C) (Oral)   Resp 16   SpO2 99%   7:31 AM Pt re-examined. GI cocktail improved pain to 3/10. Discussed need for admission given AKI and hyponatremia. He understands -- would like his home medications. Awaiting page back from [facility].   7:41 AM Discussed with Toya SmothersKaren Black, NP with [facility] who will see.   Final Clinical Impressions(s) / ED Diagnoses   Final diagnoses:  Hyponatremia  Acute kidney injury (HCC)  Precordial pain   Admit for CP, AKI, hyponatremia -- likely in part due to recent heavy alcohol consumption.   ED Discharge Orders    None       Renne CriglerGeiple, Aysha Livecchi, PA-C 04/22/17 [id number]    Ward, Layla MawKristen N, DO 04/23/17 [id number]

## 2017-04-22 NOTE — H&P (Signed)
History and Physical    Howard Becker:[id number] DOB: 1950-12-05 DOA: 04/22/2017  PCP: Leilani Able, MD Patient coming from: home  Chief Complaint: chest pain  HPI: Howard Becker is a 67 y.o. male with medical history significant for EtOH use, hypertension, chronic pain presents emergency Department chief complaint chest pain. Initial evaluation reveals hyponatremia acute kidney injury and uncontrolled hypertension. [facility] are asked to admit  Information is obtained from the patient and the chart. He complains of left-sided chest pain ongoing for approximately one week. He describes the pain as sharp located left anterior nonradiating. Associated symptoms include nausea with vomiting shortness of breath with exertion. He states he ran out of his blood pressure medications yesterday has not been able to fill them due to finances. States he saw his PCP a month ago. Reports she tried drinking beer to address his chest pain and had 9 cans of beer before coming to the hospital today. Reports typically he drinks 5-6 cans of beer. He denies headache dizziness syncope or near-syncope. He denies abdominal pain diarrhea constipation melena bright red blood per rectum. He denies dysuria hematuria frequency or urgency. He denies any lower extremity edema recent travel or sick contacts. He denies a seizure disorder but confirms home medications include Keppra.    ED Course: In the emergency department he's afebrile with a blood pressure the high end of normal. He is not hypoxic. He is provided with 3 L normal saline  Review of Systems: As per HPI otherwise all other systems reviewed and are negative.   Ambulatory Status: Ambulates independently  Past Medical History:  Diagnosis Date  . Abnormal serum level of lipase   . Acute kidney injury (HCC)   . Chest pain   . Depression   . ETOH abuse   . Hypertension   . Hyponatremia     Past Surgical History:  Procedure Laterality Date    . BACK SURGERY    . HERNIA REPAIR    . LITHOTRIPSY      Social History   Socioeconomic History  . Marital status: Married    Spouse name: Not on file  . Number of children: Not on file  . Years of education: Not on file  . Highest education level: Not on file  Social Needs  . Financial resource strain: Not on file  . Food insecurity - worry: Not on file  . Food insecurity - inability: Not on file  . Transportation needs - medical: Not on file  . Transportation needs - non-medical: Not on file  Occupational History  . Not on file  Tobacco Use  . Smoking status: Former Games developer  . Smokeless tobacco: Never Used  Substance and Sexual Activity  . Alcohol use: No  . Drug use: No  . Sexual activity: Not on file  Other Topics Concern  . Not on file  Social History Narrative  . Not on file    No Known Allergies  Family History  Problem Relation Age of Onset  . Hypertension Mother     Prior to Admission medications   Medication Sig Start Date End Date Taking? Authorizing Provider  ALPRAZolam Prudy Feeler) 0.5 MG tablet Take 0.5 mg by mouth at bedtime as needed. For sleep    [provider]  Aspirin-Acetaminophen-Caffeine (EXCEDRIN PO) Take 5 tablets by mouth 2 (two) times daily as needed. For pain    [provider]  Cetirizine HCl 10 MG CAPS 1 cap po daily for sneeze and runny  nose 08/15/15   Hayden Rasmussen, NP  Cholecalciferol (VITAMIN D3) 10000 UNITS capsule Take 10,000 Units by mouth every evening.    [provider]  cloNIDine (CATAPRES) 0.2 MG tablet Take 0.2 mg by mouth 2 (two) times daily.    [provider]  Cyanocobalamin (VITAMIN B-12 PO) Take 1 tablet by mouth every evening.    [provider]  diphenoxylate-atropine (LOMOTIL) 2.5-0.025 MG per tablet Take 1 tablet by mouth 4 (four) times daily as needed for diarrhea or loose stools. 02/01/13   Gilda Crease, MD  HYDROcodone-acetaminophen (NORCO) 5-325 MG tablet Take 1  tablet by mouth every 6 (six) hours as needed for moderate pain. 10/27/16   Mikell, Antionette Poles, MD  ipratropium (ATROVENT) 0.06 % nasal spray Place 2 sprays into both nostrils 4 (four) times daily. For runny nose 08/15/15   Hayden Rasmussen, NP  levETIRAcetam (KEPPRA) 500 MG tablet Take 1 tablet (500 mg total) by mouth every 12 (twelve) hours. 03/26/12   Johnnette Gourd, MD  loperamide (IMODIUM) 2 MG capsule Take 1 capsule (2 mg total) by mouth 4 (four) times daily as needed for diarrhea or loose stools. 04/05/15   Gerhard Munch, MD  Multiple Vitamin (MULITIVITAMIN WITH MINERALS) TABS Take 2 tablets by mouth every evening.     [provider]  Omega-3 Fatty Acids (FISH OIL PO) Take 2 capsules by mouth every evening.    [provider]  ondansetron (ZOFRAN) 4 MG tablet Take 1 tablet (4 mg total) by mouth every 6 (six) hours. 02/01/13   Gilda Crease, MD  pregabalin (LYRICA) 150 MG capsule Take 150 mg by mouth 2 (two) times daily as needed.     [provider]  traMADol (ULTRAM) 50 MG tablet Take 50 mg by mouth every 12 (twelve) hours as needed for severe pain.     [provider]  traMADol-acetaminophen (ULTRACET) 37.5-325 MG tablet Take 1 tablet by mouth every 6 (six) hours as needed. For headache 08/15/15   Hayden Rasmussen, NP    Physical Exam: Vitals:   04/22/17 0604 04/22/17 0630 04/22/17 0832 04/22/17 0833  BP: 131/72 125/87 135/81   Pulse: 76 64 71 70  Resp: 16 (!) 28  16  Temp:      TempSrc:      SpO2: 99% 99%  100%     General:  Appears somewhat anxious sitting on the edge of the bed shuffling his feet in no acute distress Eyes:  PERRL, EOMI, normal lids, iris ENT:  grossly normal hearing, lips & tongue, mmm Neck:  no LAD, masses or thyromegaly Cardiovascular:  RRR, no m/r/g. No LE edema.  Respiratory:  CTA bilaterally, no w/r/r. Normal respiratory effort. Abdomen:  soft, ntnd, positive bowel sounds no guarding or rebounding Skin:  no rash or  induration seen on limited exam Musculoskeletal:  grossly normal tone BUE/BLE, good ROM, no bony abnormality Psychiatric:  grossly normal mood and affect, speech fluent and appropriate, AOx3 Neurologic:  CN 2-12 grossly intact, moves all extremities in coordinated fashion, sensation intact  Labs on Admission: I have personally reviewed following labs and imaging studies  CBC: Recent Labs  Lab 04/22/17 0515 04/22/17 0831  WBC 8.6 9.0  HGB 14.5 13.6  HCT 40.3 37.8*  MCV 86.1 84.9  PLT 281 260   Basic Metabolic Panel: Recent Labs  Lab 04/22/17 0515  NA 123*  K 4.6  CL 94*  CO2 16*  GLUCOSE 106*  BUN 28*  CREATININE 2.68*  CALCIUM  9.4   GFR: CrCl cannot be calculated (Unknown ideal weight.). Liver Function Tests: Recent Labs  Lab 04/22/17 0610  AST 68*  ALT 40  ALKPHOS 76  BILITOT 1.2  PROT 7.3  ALBUMIN 4.0   Recent Labs  Lab 04/22/17 0610  LIPASE 70*   No results for input(s): AMMONIA in the last 168 hours. Coagulation Profile: No results for input(s): INR, PROTIME in the last 168 hours. Cardiac Enzymes: No results for input(s): CKTOTAL, CKMB, CKMBINDEX, TROPONINI in the last 168 hours. BNP (last 3 results) No results for input(s): PROBNP in the last 8760 hours. HbA1C: No results for input(s): HGBA1C in the last 72 hours. CBG: No results for input(s): GLUCAP in the last 168 hours. Lipid Profile: No results for input(s): CHOL, HDL, LDLCALC, TRIG, CHOLHDL, LDLDIRECT in the last 72 hours. Thyroid Function Tests: No results for input(s): TSH, T4TOTAL, FREET4, T3FREE, THYROIDAB in the last 72 hours. Anemia Panel: No results for input(s): VITAMINB12, FOLATE, FERRITIN, TIBC, IRON, RETICCTPCT in the last 72 hours. Urine analysis:    Component Value Date/Time   COLORURINE YELLOW 04/05/2015 2018   APPEARANCEUR CLEAR 04/05/2015 2018   LABSPEC 1.004 (L) 04/05/2015 2018   PHURINE 5.5 04/05/2015 2018   GLUCOSEU NEGATIVE 04/05/2015 2018   HGBUR NEGATIVE  04/05/2015 2018   BILIRUBINUR NEGATIVE 04/05/2015 2018   KETONESUR NEGATIVE 04/05/2015 2018   PROTEINUR NEGATIVE 04/05/2015 2018   UROBILINOGEN 0.2 08/19/2011 1129   NITRITE NEGATIVE 04/05/2015 2018   LEUKOCYTESUR NEGATIVE 04/05/2015 2018    Creatinine Clearance: CrCl cannot be calculated (Unknown ideal weight.).  Sepsis Labs: @LABRCNTIP (procalcitonin:4,lacticidven:4) )No results found for this or any previous visit (from the past 240 hour(s)).   Radiological Exams on Admission: Dg Chest 2 View  Result Date: 04/22/2017 CLINICAL DATA:  Chest pain. EXAM: CHEST  2 VIEW COMPARISON:  Radiographs 03/26/2012 FINDINGS: Chronic hyperinflation and interstitial coarsening. Unchanged heart size and mediastinal contours. No consolidation. No pulmonary edema, pleural effusion or pneumothorax. No acute osseous abnormalities. IMPRESSION: Chronic hyperinflation and interstitial thickening. No acute abnormality. Electronically Signed   By: Rubye Oaks M.D.   On: 04/22/2017 05:40    EKG: Normal sinus rhythm Minimal voltage criteria for LVH, may be normal variant Borderline ECGNo significant change since last tracing  Assessment/Plan Active Problems:   Chest pain   Hyponatremia   Abnormal serum level of lipase   Acute kidney injury (HCC)   Hypertension   ETOH abuse   #1. Chest pain. Atypical. Heart score 4. Likely gi etiology in setting of ETOH binge and uncontrolled BP.  Initial troponin negative. EKG without acute abnormalities. Chest x-ray reveals chronic hyperinflation and interstitial thickening. He's not hypoxic. Improved with GI cocktail. Is provided with an aspirin in the emergency department -Admit to telemetry -Cycle troponin -Serial EKG -lipid panel -Continue GI cocktail -Protonic  #2. Hyponatremia. Serum sodium 123 on admission. Likely related to increased EtOH consumption of late. Received 2 L of normal saline in the emergency department Asymptomatic -Obtain a serum  osmolality -Hold IV fluids for now -Obtain urine sodium and osmolality -serial bmets  3. Acute kidney injury. Likely related to decreased oral intake in the setting of increased EtOH consumption. Creatinine 2.68 in the emergency department. Most recent noted in chart over a year ago it was within the limits of normal -He received 2 L of normal saline in the emergency department -Hold any further IV fluids for now -Hold nephrotoxins -Monitor urine output -Recheck  #4. Elevated lipase. Mild elevation of his lipase  noted and workup. Likely related to EtOH abuse. No abdominal pain mild nausea no vomiting -Monitor -Clear liquid diet  #5. Hypertension. Blood pressure elevated in the emergency department. Home medications include clonidine. -Resume -When necessary hydralazine  #6. EtOH use. -ciwa protoocol   DVT prophylaxis: heparin  Code Status: full  Family Communication: none present Disposition Plan: home Consults called: none  Admission status: obs    Toya Smothers M MD [facility]  If 7PM-7AM, please contact night-coverage [email or web address] Password [email or web address]  04/22/2017, 9:10 AM

## 2017-04-22 NOTE — Progress Notes (Signed)
Alert male admiltted and assisted into bed. Skin check reveals intact skin.   Pt cooperative and resting in bed watching TV at this time. Will continue to monitor.

## 2017-04-22 NOTE — Progress Notes (Signed)
Dr. Clearnce SorrelPurohit notified of pt arrival on 3E per order.

## 2017-04-22 NOTE — ED Notes (Signed)
Patient arrived to 5C  

## 2017-04-23 ENCOUNTER — Observation Stay (HOSPITAL_COMMUNITY): Payer: Medicare Other

## 2017-04-23 ENCOUNTER — Observation Stay (HOSPITAL_BASED_OUTPATIENT_CLINIC_OR_DEPARTMENT_OTHER): Payer: Medicare Other

## 2017-04-23 DIAGNOSIS — I10 Essential (primary) hypertension: Secondary | ICD-10-CM | POA: Diagnosis present

## 2017-04-23 DIAGNOSIS — N179 Acute kidney failure, unspecified: Secondary | ICD-10-CM | POA: Diagnosis present

## 2017-04-23 DIAGNOSIS — F1010 Alcohol abuse, uncomplicated: Secondary | ICD-10-CM | POA: Diagnosis present

## 2017-04-23 DIAGNOSIS — Z87891 Personal history of nicotine dependence: Secondary | ICD-10-CM | POA: Diagnosis not present

## 2017-04-23 DIAGNOSIS — R072 Precordial pain: Secondary | ICD-10-CM | POA: Diagnosis present

## 2017-04-23 DIAGNOSIS — R079 Chest pain, unspecified: Secondary | ICD-10-CM | POA: Diagnosis not present

## 2017-04-23 DIAGNOSIS — Z79899 Other long term (current) drug therapy: Secondary | ICD-10-CM | POA: Diagnosis not present

## 2017-04-23 DIAGNOSIS — R0789 Other chest pain: Secondary | ICD-10-CM | POA: Diagnosis present

## 2017-04-23 DIAGNOSIS — R748 Abnormal levels of other serum enzymes: Secondary | ICD-10-CM | POA: Diagnosis present

## 2017-04-23 DIAGNOSIS — Z79891 Long term (current) use of opiate analgesic: Secondary | ICD-10-CM | POA: Diagnosis not present

## 2017-04-23 DIAGNOSIS — G8929 Other chronic pain: Secondary | ICD-10-CM | POA: Diagnosis present

## 2017-04-23 DIAGNOSIS — E871 Hypo-osmolality and hyponatremia: Secondary | ICD-10-CM | POA: Diagnosis present

## 2017-04-23 LAB — CBC
HCT: 38.2 % — ABNORMAL LOW (ref 39.0–52.0)
Hemoglobin: 13.5 g/dL (ref 13.0–17.0)
MCH: 31.1 pg (ref 26.0–34.0)
MCHC: 35.3 g/dL (ref 30.0–36.0)
MCV: 88 fL (ref 78.0–100.0)
PLATELETS: 252 10*3/uL (ref 150–400)
RBC: 4.34 MIL/uL (ref 4.22–5.81)
RDW: 14.1 % (ref 11.5–15.5)
WBC: 6.9 10*3/uL (ref 4.0–10.5)

## 2017-04-23 LAB — BASIC METABOLIC PANEL
ANION GAP: 9 (ref 5–15)
Anion gap: 11 (ref 5–15)
BUN: 28 mg/dL — ABNORMAL HIGH (ref 6–20)
BUN: 29 mg/dL — ABNORMAL HIGH (ref 6–20)
CALCIUM: 8.8 mg/dL — AB (ref 8.9–10.3)
CHLORIDE: 99 mmol/L — AB (ref 101–111)
CHLORIDE: 99 mmol/L — AB (ref 101–111)
CO2: 19 mmol/L — ABNORMAL LOW (ref 22–32)
CO2: 19 mmol/L — AB (ref 22–32)
CREATININE: 2.21 mg/dL — AB (ref 0.61–1.24)
Calcium: 8.9 mg/dL (ref 8.9–10.3)
Creatinine, Ser: 2.18 mg/dL — ABNORMAL HIGH (ref 0.61–1.24)
GFR calc Af Amer: 34 mL/min — ABNORMAL LOW (ref >60)
GFR calc non Af Amer: 30 mL/min — ABNORMAL LOW (ref >60)
GFR, EST AFRICAN AMERICAN: 34 mL/min — AB (ref >60)
GFR, EST NON AFRICAN AMERICAN: 29 mL/min — AB (ref >60)
GLUCOSE: 110 mg/dL — AB (ref 65–99)
Glucose, Bld: 104 mg/dL — ABNORMAL HIGH (ref 65–99)
POTASSIUM: 4.9 mmol/L (ref 3.5–5.1)
POTASSIUM: 5.1 mmol/L (ref 3.5–5.1)
SODIUM: 129 mmol/L — AB (ref 135–145)
Sodium: 127 mmol/L — ABNORMAL LOW (ref 135–145)

## 2017-04-23 LAB — ECHOCARDIOGRAM COMPLETE
CHL CUP MV DEC (S): 253
CHL CUP RV SYS PRESS: 12 mmHg
E/e' ratio: 7.27
EWDT: 253 ms
FS: 28 % (ref 28–44)
HEIGHTINCHES: 73 in
IV/PV OW: 1.11
LA ID, A-P, ES: 34 mm
LA diam end sys: 34 mm
LA vol A4C: 34.6 ml
LA vol index: 20.6 mL/m2
LA vol: 43 mL
LADIAMINDEX: 1.63 cm/m2
LV E/e' medial: 7.27
LV E/e'average: 7.27
LV TDI E'LATERAL: 7.07
LV dias vol index: 48 mL/m2
LV e' LATERAL: 7.07 cm/s
LV sys vol: 63 mL — AB (ref 21–61)
LVDIAVOL: 101 mL (ref 62–150)
LVOT VTI: 17.1 cm
LVOT area: 4.15 cm2
LVOT peak vel: 79.1 cm/s
LVOTD: 23 mm
LVOTSV: 71 mL
LVSYSVOLIN: 30 mL/m2
MV pk E vel: 51.4 m/s
MVPKAVEL: 55.3 m/s
PW: 9 mm — AB (ref 0.6–1.1)
RV LATERAL S' VELOCITY: 9.57 cm/s
RV TAPSE: 22.6 mm
Reg peak vel: 150 cm/s
Simpson's disk: 37
Stroke v: 38 ml
TDI e' medial: 6.85
TRMAXVEL: 150 cm/s
WEIGHTICAEL: 2972.8 [oz_av]

## 2017-04-23 LAB — CREATININE, URINE, RANDOM: CREATININE, URINE: 61.83 mg/dL

## 2017-04-23 LAB — LIPASE, BLOOD: LIPASE: 51 U/L (ref 11–51)

## 2017-04-23 LAB — SODIUM, URINE, RANDOM: Sodium, Ur: 40 mmol/L

## 2017-04-23 MED ORDER — PREGABALIN 100 MG PO CAPS
600.0000 mg | ORAL_CAPSULE | Freq: Every day | ORAL | Status: DC
  Administered 2017-04-23 – 2017-04-25 (×3): 600 mg via ORAL
  Filled 2017-04-23 (×3): qty 6

## 2017-04-23 MED ORDER — LORAZEPAM 2 MG/ML IJ SOLN
1.0000 mg | INTRAMUSCULAR | Status: DC | PRN
Start: 2017-04-23 — End: 2017-04-26

## 2017-04-23 MED ORDER — SODIUM CHLORIDE 0.9 % IV SOLN
INTRAVENOUS | Status: DC
  Administered 2017-04-23 – 2017-04-26 (×5): via INTRAVENOUS

## 2017-04-23 MED ORDER — BOOST / RESOURCE BREEZE PO LIQD CUSTOM
1.0000 | Freq: Three times a day (TID) | ORAL | Status: DC
  Administered 2017-04-23 – 2017-04-25 (×6): 1 via ORAL

## 2017-04-23 MED ORDER — PRO-STAT SUGAR FREE PO LIQD
30.0000 mL | Freq: Two times a day (BID) | ORAL | Status: DC
  Administered 2017-04-23 – 2017-04-25 (×4): 30 mL via ORAL
  Filled 2017-04-23 (×4): qty 30

## 2017-04-23 NOTE — Progress Notes (Signed)
  Echocardiogram 2D Echocardiogram has been performed.  [name], Howard Becker 04/23/2017, 5:16 PM

## 2017-04-23 NOTE — Progress Notes (Signed)
Nutrition Brief Note  Patient identified on the Malnutrition Screening Tool (MST) Report  Attempted to see patient due to reports of poor intake/weight loss. Patient in room with lights all. Difficult to arouse and quickly falls back asleep. Unable to get any information. RN reports he had been given pain medication shortly before.   He has eaten 100% of his Breakfast and Lunch, though these were clear liquids.   Per chart, he was 194 lbs 14 months ago. and 219 lbs ~2 years ago. There is no recent wt hx.   He has long history of alcohol abuse. Social work consult pending.   Pt limited to clear liquids due to elevated Lipase.   Will add CL supplements, monitor for diet advancement and F/U next week if still admitted.   Christophe LouisNathan Leilanni Halvorson RD, LDN, CNSC Clinical Nutrition Pager: [phone] 04/23/2017 6:05 PM

## 2017-04-23 NOTE — Progress Notes (Signed)
Pt is alert and oriented times 4, CIWA been negative but he keep asking about lyrica for foot pain and Norco for headache all day long, little sleepy this evening, IV fluid continue @75cc /hr, still in clear liquid diet, verified with MD, Echo done today, will continue to monitor the patient  Lonia FarberRekha, RN

## 2017-04-23 NOTE — Progress Notes (Signed)
Pharmacy has returned 2 medication bottles to this RN because they are empty. Empty medication bottles are located underneath the nurse's station desk in the black pt belonging bin.

## 2017-04-23 NOTE — Progress Notes (Addendum)
[facility] PROGRESS NOTE  Howard Becker:[id number] DOB: 12-May-1950 DOA: 04/22/2017 PCP: Leilani Able, MD  Assessment/Plan:  #1. Chest pain. Atypical. Heart score 4. Likely gi etiology in setting of ETOH binge and uncontrolled BP.  Initial troponin negative. EKG without acute abnormalities. Chest x-ray reveals chronic hyperinflation and interstitial thickening. He's not hypoxic. Improved with GI cocktail. Is provided with an aspirin in the emergency department -Admit to telemetry -Cycle troponin -Serial EKG -lipid panel -Continue GI cocktail -Protonix - ECHO pending  #2. Hyponatremia. Serum sodium 123 on admission. Likely related to increased EtOH consumption of late. Received 2 L of normal saline in the emergency department Asymptomatic -Obtain a serum osmolality -Hold IV fluids for now -Obtain urine sodium and osmolality -serial bmets  3. Acute kidney injury. Likely related to decreased oral intake in the setting of increased EtOH consumption. Creatinine 2.68 in the emergency department. Most recent noted in chart over a year ago it was within the limits of normal - 2.68 admit-->2.21 today - renal US, FeNa labs ordered -He received 2 L of normal saline in the emergency department -Hold any further IV fluids for now -Hold nephrotoxins -Monitor urine output -Recheck  #4. Elevated lipase. Mild elevation of his lipase noted and workup. Likely related to EtOH abuse. No abdominal pain mild nausea no vomiting -Monitor -Clear liquid diet - recheck  #5. Hypertension. Blood pressure elevated in the emergency department. Home medications include clonidine. -Resume -When necessary hydralazine  #6. EtOH use. -ciwa protoocol  #7 chronic pain - restart lyrics  Note Keppra stopped. Pt states he has needed med in years. Added ativan as a precaution.  DVT prophylaxis: heparin  Code Status: full  Family Communication: none present Disposition Plan: home Consults  called: none  Admission status: obs       Consultants:  ---  Procedures:  ---  Antibiotics:  --- (indicate start date, and stop date if known)  HPI/Subjective: No complaints.  Objective: Vitals:   04/23/17 0021 04/23/17 0513  BP: (!) 99/57 104/61  Pulse: 63 72  Resp: 18 16  Temp: 97.9 F (36.6 C) 97.8 F (36.6 C)  SpO2: 100% 98%    Intake/Output Summary (Last 24 hours) at 04/23/2017 0756 Last data filed at 04/22/2017 2345 Gross per 24 hour  Intake 240 ml  Output 350 ml  Net -110 ml   Filed Weights   04/23/17 0513  Weight: 84.3 kg (185 lb 12.8 oz)    Exam:   General:  NCAT  Cardiovascular: RRR, no MRG  Respiratory: CTAB, nl wob  Abdomen: BS+, ND  Musculoskeletal: moving all extr   Data Reviewed: Basic Metabolic Panel: Recent Labs  Lab 04/22/17 0515 04/22/17 0831 04/22/17 1253 04/22/17 1932 04/23/17 0454  NA 123*  --  126* 127* 129*  K 4.6  --  4.9 4.8 4.9  CL 94*  --  100* 101 99*  CO2 16*  --  15* 17* 19*  GLUCOSE 106*  --  110* 111* 104*  BUN 28*  --  26* 26* 28*  CREATININE 2.68* 2.53* 2.48* 2.36* 2.21*  CALCIUM 9.4  --  8.9 8.9 8.9   Liver Function Tests: Recent Labs  Lab 04/22/17 0610  AST 68*  ALT 40  ALKPHOS 76  BILITOT 1.2  PROT 7.3  ALBUMIN 4.0   Recent Labs  Lab 04/22/17 0610  LIPASE 70*   No results for input(s): AMMONIA in the last 168 hours. CBC: Recent Labs  Lab 04/22/17 0515 04/22/17 0831 04/23/17  0454  WBC 8.6 9.0 6.9  HGB 14.5 13.6 13.5  HCT 40.3 37.8* 38.2*  MCV 86.1 84.9 88.0  PLT 281 260 252   Cardiac Enzymes: Recent Labs  Lab 04/22/17 0831 04/22/17 1300  TROPONINI <0.03 <0.03   BNP (last 3 results) No results for input(s): BNP in the last 8760 hours.  ProBNP (last 3 results) No results for input(s): PROBNP in the last 8760 hours.  CBG: No results for input(s): GLUCAP in the last 168 hours.  No results found for this or any previous visit (from the past 240 hour(s)).    Studies: Dg Chest 2 View  Result Date: 04/22/2017 CLINICAL DATA:  Chest pain. EXAM: CHEST  2 VIEW COMPARISON:  Radiographs 03/26/2012 FINDINGS: Chronic hyperinflation and interstitial coarsening. Unchanged heart size and mediastinal contours. No consolidation. No pulmonary edema, pleural effusion or pneumothorax. No acute osseous abnormalities. IMPRESSION: Chronic hyperinflation and interstitial thickening. No acute abnormality. Electronically Signed   By: Rubye Oaks M.D.   On: 04/22/2017 05:40    Scheduled Meds: . aspirin EC  325 mg Oral Daily  . cloNIDine  0.2 mg Oral BID  . folic acid  1 mg Oral Daily  . heparin  5,000 Units Subcutaneous Q8H  . ipratropium  2 spray Each Nare QID  . levETIRAcetam  500 mg Oral Q12H  . multivitamin with minerals  1 tablet Oral Daily  . pantoprazole (PROTONIX) IV  40 mg Intravenous Q24H  . thiamine  100 mg Oral Daily   Or  . thiamine  100 mg Intravenous Daily   Continuous Infusions:  Active Problems:   Chest pain   Hyponatremia   Abnormal serum level of lipase   Acute kidney injury (HCC)   Hypertension   ETOH abuse    Time spent: 90    Haydee Salter  [facility] Pager AMION. If 7PM-7AM, please contact night-coverage at [email or web address], password [facility] 04/23/2017, 7:56 AM  LOS: 0 days

## 2017-04-24 LAB — BASIC METABOLIC PANEL
ANION GAP: 8 (ref 5–15)
Anion gap: 10 (ref 5–15)
BUN: 31 mg/dL — ABNORMAL HIGH (ref 6–20)
BUN: 34 mg/dL — ABNORMAL HIGH (ref 6–20)
CALCIUM: 8.6 mg/dL — AB (ref 8.9–10.3)
CHLORIDE: 101 mmol/L (ref 101–111)
CO2: 20 mmol/L — ABNORMAL LOW (ref 22–32)
CO2: 21 mmol/L — ABNORMAL LOW (ref 22–32)
Calcium: 8.8 mg/dL — ABNORMAL LOW (ref 8.9–10.3)
Chloride: 103 mmol/L (ref 101–111)
Creatinine, Ser: 1.96 mg/dL — ABNORMAL HIGH (ref 0.61–1.24)
Creatinine, Ser: 2.12 mg/dL — ABNORMAL HIGH (ref 0.61–1.24)
GFR calc Af Amer: 39 mL/min — ABNORMAL LOW (ref >60)
GFR, EST AFRICAN AMERICAN: 35 mL/min — AB (ref >60)
GFR, EST NON AFRICAN AMERICAN: 34 mL/min — AB (ref >60)
GFR, EST NON AFRICAN AMERICAN: 31 mL/min — AB (ref >60)
GLUCOSE: 81 mg/dL (ref 65–99)
Glucose, Bld: 121 mg/dL — ABNORMAL HIGH (ref 65–99)
POTASSIUM: 4.3 mmol/L (ref 3.5–5.1)
Potassium: 4.3 mmol/L (ref 3.5–5.1)
SODIUM: 131 mmol/L — AB (ref 135–145)
Sodium: 132 mmol/L — ABNORMAL LOW (ref 135–145)

## 2017-04-24 LAB — CREATININE, URINE, RANDOM: CREATININE, URINE: 36.27 mg/dL

## 2017-04-24 MED ORDER — PANTOPRAZOLE SODIUM 40 MG PO TBEC
40.0000 mg | DELAYED_RELEASE_TABLET | Freq: Every day | ORAL | Status: DC
  Administered 2017-04-25 – 2017-04-26 (×2): 40 mg via ORAL
  Filled 2017-04-24 (×2): qty 1

## 2017-04-24 NOTE — Plan of Care (Signed)
  Health Behavior/Discharge Planning: Ability to manage health-related needs will improve 04/24/2017 2336 - Progressing by Calyn Sivils A, RN   Nutrition: Adequate nutrition will be maintained 04/24/2017 2336 - Progressing by Geisha Abernathy A, RN   Pain Managment: General experience of comfort will improve 04/24/2017 2336 - Progressing by Orla Estrin A, RN   Safety: Ability to remain free from injury will improve 04/24/2017 2336 - Progressing by Jaelle Campanile, Marlana Salvageonnie A, RN
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
## 2017-04-24 NOTE — Progress Notes (Signed)
Pt ambulated in a hallway couple of times, given pain medicines around the clock for a complain of leg pain, vitals (BP) in a softer side, IV NS continue at 75 cc/hr, tolerating medicines well, will continue to monitor the patient  Lonia FarberRekha, RN

## 2017-04-24 NOTE — Progress Notes (Signed)
[facility] PROGRESS NOTE  Howard Becker WUJ:[id number] DOB: February 06, 1951 DOA: 04/22/2017 PCP: Leilani Able, MD  Assessment/Plan:  #1. Chest pain. Atypical. Heart score 4. Likely gi etiology in setting of ETOH binge and uncontrolled BP.  Initial troponin negative. EKG without acute abnormalities. Chest x-ray reveals chronic hyperinflation and interstitial thickening. He's not hypoxic. Improved with GI cocktail. Is provided with an aspirin in the emergency department -Admit to telemetry -Cycleed troponin -Protonix - ECHO complete, discussed with cardiology, nothing to do as QT nl, pt can fu with PCP  #2. Hyponatremia. Serum sodium 123 on admission. Likely related to increased EtOH consumption of late. Received 2 L of normal saline in the emergency department Asymptomatic -Obtain a serum osmolality-->270 - IV fluids for now -Obtain urine sodium and cr, pending  3. Acute kidney injury. Likely related to decreased oral intake in the setting of increased EtOH consumption. Creatinine 2.68 in the emergency department. Most recent noted in chart over a year ago it was within the limits of normal - 2.68 admit-->2.21 -->1.96 - renal US, FeNa labs ordered -He received 2 L of normal saline in the emergency department -Gentle hydration -Avoid nephrotoxins -Monitor urine output -Fena labs ordred - renal U/S normal  #4. Elevated lipase. Mild elevation of his lipase noted and workup. Likely related to EtOH abuse. No abdominal pain mild nausea no vomiting -Monitor -Clear liquid diet - recheck  #5. Hypertension. Blood pressure elevated in the emergency department. Home medications include clonidine. -Resume -When necessary hydralazine  #6. EtOH use. -ciwa protoocol  #7 chronic pain - Cont lyrica - prn vicodin  Note Keppra stopped. Pt states he has needed med in years. Added ativan as a precaution.  DVT prophylaxis: heparin  Code Status: full  Family Communication: none  present Disposition Plan: home Consults called: none  Admission status: obs       Consultants:  ---  Procedures:  ---  Antibiotics:  --- (indicate start date, and stop date if known)  HPI/Subjective: No complaints.  Objective: Vitals:   04/24/17 0605 04/24/17 0933  BP: 103/67 100/60  Pulse: 62   Resp: 18   Temp: 97.6 F (36.4 C)   SpO2: 98%     Intake/Output Summary (Last 24 hours) at 04/24/2017 1040 Last data filed at 04/24/2017 9562 Gross per 24 hour  Intake 2228.75 ml  Output 2600 ml  Net -371.25 ml   Filed Weights   04/23/17 0513 04/24/17 0605  Weight: 84.3 kg (185 lb 12.8 oz) 84.9 kg (187 lb 3.2 oz)    Exam:   General:  NCAT  Cardiovascular: RRR, no MRG  Respiratory: CTAB, nl wob  Abdomen: BS+, ND  Musculoskeletal: moving all extr   Data Reviewed: Basic Metabolic Panel: Recent Labs  Lab 04/22/17 1253 04/22/17 1932 04/23/17 0454 04/23/17 1142 04/24/17 0723  NA 126* 127* 129* 127* 131*  K 4.9 4.8 4.9 5.1 4.3  CL 100* 101 99* 99* 101  CO2 15* 17* 19* 19* 20*  GLUCOSE 110* 111* 104* 110* 121*  BUN 26* 26* 28* 29* 31*  CREATININE 2.48* 2.36* 2.21* 2.18* 1.96*  CALCIUM 8.9 8.9 8.9 8.8* 8.8*   Liver Function Tests: Recent Labs  Lab 04/22/17 0610  AST 68*  ALT 40  ALKPHOS 76  BILITOT 1.2  PROT 7.3  ALBUMIN 4.0   Recent Labs  Lab 04/22/17 0610 04/23/17 0748  LIPASE 70* 51   No results for input(s): AMMONIA in the last 168 hours. CBC: Recent Labs  Lab 04/22/17 0515  04/22/17 0831 04/23/17 0454  WBC 8.6 9.0 6.9  HGB 14.5 13.6 13.5  HCT 40.3 37.8* 38.2*  MCV 86.1 84.9 88.0  PLT 281 260 252   Cardiac Enzymes: Recent Labs  Lab 04/22/17 0831 04/22/17 1300  TROPONINI <0.03 <0.03   BNP (last 3 results) No results for input(s): BNP in the last 8760 hours.  ProBNP (last 3 results) No results for input(s): PROBNP in the last 8760 hours.  CBG: No results for input(s): GLUCAP in the last 168 hours.  No results  found for this or any previous visit (from the past 240 hour(s)).   Studies: US Renal  Result Date: 04/23/2017 CLINICAL DATA:  Acute renal injury 04/22/2017. EXAM: RENAL / URINARY TRACT ULTRASOUND COMPLETE COMPARISON:  CT 04/05/2015 FINDINGS: Right Kidney: Length: 11.0 cm. Echogenicity within normal limits. No mass or hydronephrosis visualized. Left Kidney: Length: 11.5 cm. Echogenicity within normal limits. No mass or hydronephrosis visualized. Bladder: Appears normal for degree of bladder distention. IMPRESSION: Normal renal ultrasound. Electronically Signed   By: Elberta Fortis M.D.   On: 04/23/2017 09:12    Scheduled Meds: . aspirin EC  325 mg Oral Daily  . cloNIDine  0.2 mg Oral BID  . feeding supplement  1 Container Oral TID BM  . feeding supplement (PRO-STAT SUGAR FREE 64)  30 mL Oral BID  . folic acid  1 mg Oral Daily  . heparin  5,000 Units Subcutaneous Q8H  . ipratropium  2 spray Each Nare QID  . multivitamin with minerals  1 tablet Oral Daily  . pantoprazole (PROTONIX) IV  40 mg Intravenous Q24H  . pregabalin  600 mg Oral Daily  . thiamine  100 mg Oral Daily   Or  . thiamine  100 mg Intravenous Daily   Continuous Infusions: . sodium chloride 75 mL/hr at 04/24/17 0128    Active Problems:   Chest pain   Hyponatremia   Abnormal serum level of lipase   Acute kidney injury (HCC)   Hypertension   ETOH abuse    Time spent: 58    Haydee Salter  [facility] Pager AMION. If 7PM-7AM, please contact night-coverage at [email or web address], password [facility] 04/24/2017, 10:40 AM  LOS: 1 day

## 2017-04-24 NOTE — Plan of Care (Signed)
  Progressing Safety: Ability to remain free from injury will improve 04/24/2017 0314 - Progressing by Myna BrightSmith, Kameran Lallier Y, RN
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
## 2017-04-25 DIAGNOSIS — N179 Acute kidney failure, unspecified: Principal | ICD-10-CM

## 2017-04-25 LAB — BASIC METABOLIC PANEL
ANION GAP: 11 (ref 5–15)
ANION GAP: 10 (ref 5–15)
BUN: 36 mg/dL — AB (ref 6–20)
BUN: 28 mg/dL — ABNORMAL HIGH (ref 6–20)
CHLORIDE: 105 mmol/L (ref 101–111)
CO2: 18 mmol/L — ABNORMAL LOW (ref 22–32)
CO2: 22 mmol/L (ref 22–32)
Calcium: 8.5 mg/dL — ABNORMAL LOW (ref 8.9–10.3)
Calcium: 8.6 mg/dL — ABNORMAL LOW (ref 8.9–10.3)
Chloride: 106 mmol/L (ref 101–111)
Creatinine, Ser: 1.87 mg/dL — ABNORMAL HIGH (ref 0.61–1.24)
Creatinine, Ser: 1.6 mg/dL — ABNORMAL HIGH (ref 0.61–1.24)
GFR calc non Af Amer: 36 mL/min — ABNORMAL LOW (ref >60)
GFR, EST AFRICAN AMERICAN: 41 mL/min — AB (ref >60)
GFR, EST AFRICAN AMERICAN: 50 mL/min — AB (ref >60)
GFR, EST NON AFRICAN AMERICAN: 43 mL/min — AB (ref >60)
Glucose, Bld: 99 mg/dL (ref 65–99)
Glucose, Bld: 81 mg/dL (ref 65–99)
POTASSIUM: 5.4 mmol/L — AB (ref 3.5–5.1)
POTASSIUM: 4 mmol/L (ref 3.5–5.1)
SODIUM: 134 mmol/L — AB (ref 135–145)
SODIUM: 138 mmol/L (ref 135–145)

## 2017-04-25 LAB — CK: CK TOTAL: 138 U/L (ref 49–397)

## 2017-04-25 MED ORDER — CLONIDINE HCL 0.1 MG PO TABS: 0.1000 mg | ORAL_TABLET | Freq: Every day | ORAL | Status: DC

## 2017-04-25 MED ORDER — PREGABALIN 100 MG PO CAPS
300.0000 mg | ORAL_CAPSULE | Freq: Two times a day (BID) | ORAL | Status: DC
  Administered 2017-04-26: 300 mg via ORAL
  Filled 2017-04-25: qty 3

## 2017-04-25 MED ORDER — ZOLPIDEM TARTRATE 5 MG PO TABS
5.0000 mg | ORAL_TABLET | Freq: Once | ORAL | Status: AC
  Administered 2017-04-25: 5 mg via ORAL
  Filled 2017-04-25: qty 1

## 2017-04-25 MED ORDER — SODIUM POLYSTYRENE SULFONATE 15 GM/60ML PO SUSP
30.0000 g | Freq: Once | ORAL | Status: AC
  Administered 2017-04-25: 30 g via ORAL
  Filled 2017-04-25: qty 120

## 2017-04-25 NOTE — Progress Notes (Signed)
Nutrition Follow-up  DOCUMENTATION CODES:   Not applicable  INTERVENTION:   -D/c Prostat -Continue Boost Breeze po TID, each supplement provides 250 kcal and 9 grams of protein -Continue MVI daily  NUTRITION DIAGNOSIS:   Increased nutrient needs related to chronic illness as evidenced by estimated needs.  Ongoing  GOAL:   Patient will meet greater than or equal to 90% of their needs  Progressing  MONITOR:   PO intake, Supplement acceptance, Labs, Weight trends, Skin, I & O's  REASON FOR ASSESSMENT:   Malnutrition Screening Tool    ASSESSMENT:   Howard Becker is a 67 y.o. male with medical history significant for EtOH use, hypertension, chronic pain presents emergency Department chief complaint chest pain.  Pt admitted with chest pain.   Spoke with pt at bedside, who was talkative bu tangential at time of visit. He is eager to go home. He shares with this RD that he has not had a good appetite for "months". Typical intake is 2 times per day (one large meal such as a meat, starch, and vegetable, and a small meal such as a fish or chicken sandwich). Observed pt lunch tray- he consumed approximately 75% of meal (except vegetable blend and roll). RN just provided [employer]ost Breeze supplement, which pt reports he enjoys.   Pt reports UBW is around 200#. He reports he has lost 10# over the past week, however, this is not consistent with wt hx.   Medications reviewed and include MVI, thiamine, and folic acid.   Pt at risk for malnutrition given ETOH abuse ans erratic meal intake, however, RD unable to identify at this time.   Labs reviewed: Na: 134 (on IV supplementation), K: 5.4.   NUTRITION - FOCUSED PHYSICAL EXAM:    Most Recent Value  Orbital Region  Moderate depletion  Upper Arm Region  No depletion  Thoracic and Lumbar Region  No depletion  Buccal Region  Mild depletion  Temple Region  Mild depletion  Clavicle Bone Region  No depletion  Clavicle and Acromion Bone  Region  No depletion  Scapular Bone Region  No depletion  Dorsal Hand  No depletion  Patellar Region  No depletion  Anterior Thigh Region  No depletion  Posterior Calf Region  No depletion  Edema (RD Assessment)  Mild  Hair  Reviewed  Eyes  Reviewed  Mouth  Reviewed  Skin  Reviewed  Nails  Reviewed       Diet Order:  Diet Heart Room service appropriate? Yes; Fluid consistency: Thin  EDUCATION NEEDS:   Education needs have been addressed  Skin:  Skin Assessment: Reviewed RN Assessment  Last BM:  04/24/17  Height:   Ht Readings from Last 1 Encounters:  04/23/17 6\' 1"  (1.854 m)    Weight:   Wt Readings from Last 1 Encounters:  04/25/17 197 lb (89.4 kg)    Ideal Body Weight:  83.6 kg  BMI:  Body mass index is 25.99 kg/m.  Estimated Nutritional Needs:   Kcal:  2000-2200  Protein:  100-115 grams  Fluid:  2.0-2.2 L    Salma Walrond A. Mayford KnifeWilliams, RD, LDN, CDE Pager: [phone] After hours Pager: [phone]

## 2017-04-25 NOTE — Progress Notes (Signed)
[facility] Progress Note  Patient: Howard Becker UJW:[id number]   PCP: Leilani Able, MD DOB: 1950/05/11   DOA: 04/22/2017   DOS: 04/25/2017   Date of Service: the patient was seen and examined on 04/25/2017  Subjective: Feeling better.  No nausea no vomiting.  No chest pain right now mentions that he occasionally has chest pain that lasts for a few seconds.  No shortness of breath.  No diarrhea no constipation.  Urinating okay.  Brief hospital course: Pt. with PMH of alcohol abuse, HTN, chronic pain; admitted on 04/22/2017, presented with complaint of chest pain, was found to have hyponatremia and acute kidney injury. Currently further plan is continue IV hydration.  Assessment and Plan: #1. Chest pain. Atypical. Heart score 4.  Troponins are negative. EKG nonischemic as well. Based on the HPI chest pain appears to be GI origin. Echocardiogram performed, EF 40-45%. Earlier provider discussed with cardiology recommend no inpatient workup.  Recommendation was to follow-up with primary care physician as an outpatient for this Continue PPI.  #2. Hyponatremia. Serum sodium 123 on admission. Likely related to increased EtOH consumption of late. Received 2 L of normal saline in the emergency department Asymptomatic Sodium 134 this morning.  Normal.  Monitor.  3. Acute kidney injury.  Likely related to decreased oral intake in the setting of increased EtOH consumption. Creatinine 2.68 in the emergency department. Most recent noted in chart over a year ago it was within the limits of normal Renal ultrasound normal.  Urine output normal. Check urinalysis.  Increase IV fluid rate.  #4. Elevated lipase. Mild elevation of his lipase noted and workup. Likely related to EtOH abuse. No abdominal pain mild nausea no vomiting Advance to soft diet.  Abdominal pain.  #5. Hypertension. Blood pressure elevated in the emergency department. Home medications include clonidine. -Resume -When  necessary hydralazine  #6. EtOH use. Continue CIWA protocol.  #7 chronic pain - Cont lyrica - prn vicodin   Diet: soft diet DVT Prophylaxis: subcutaneous Heparin Advance goals of care discussion: full code  Family Communication: no family was present at bedside, at the time of interview.   Disposition:  Discharge to home.  Consultants: none Procedures: none  Antibiotics: Anti-infectives (From admission, onward)   None       Objective: Physical Exam: Vitals:   04/24/17 1554 04/24/17 2033 04/25/17 0649 04/25/17 1144  BP: (!) 100/50 97/62 (!) 102/59 116/74  Pulse:  63 68 79  Resp:  16  18  Temp:  (!) 97.5 F (36.4 C) 98 F (36.7 C) 97.6 F (36.4 C)  TempSrc:  Oral Oral Oral  SpO2:  98% 97% 98%  Weight:   89.4 kg (197 lb)   Height:        Intake/Output Summary (Last 24 hours) at 04/25/2017 1645 Last data filed at 04/25/2017 1439 Gross per 24 hour  Intake 1437 ml  Output 1350 ml  Net 87 ml   Filed Weights   04/23/17 0513 04/24/17 0605 04/25/17 0649  Weight: 84.3 kg (185 lb 12.8 oz) 84.9 kg (187 lb 3.2 oz) 89.4 kg (197 lb)   General: Alert, Awake and Oriented to Time, Place and Person. Appear in mild distress, affect appropriate Eyes: PERRL, Conjunctiva normal ENT: Oral Mucosa clear moist. Neck: no JVD, no Abnormal Mass Or lumps Cardiovascular: S1 and S2 Present, no Murmur, Peripheral Pulses Present Respiratory: normal respiratory effort, Bilateral Air entry equal and Decreased, no use of accessory muscle, Clear to Auscultation, no Crackles, no wheezes Abdomen: Bowel  Sound present, Soft and no tenderness, no hernia Skin: no redness, no Rash, no induration Extremities: no Pedal edema, no calf tenderness Neurologic: Grossly no focal neuro deficit. Bilaterally Equal motor strength  Data Reviewed: CBC: Recent Labs  Lab 04/22/17 0515 04/22/17 0831 04/23/17 0454  WBC 8.6 9.0 6.9  HGB 14.5 13.6 13.5  HCT 40.3 37.8* 38.2*  MCV 86.1 84.9 88.0  PLT 281 260  252   Basic Metabolic Panel: Recent Labs  Lab 04/23/17 0454 04/23/17 1142 04/24/17 0723 04/24/17 1354 04/25/17 0555  NA 129* 127* 131* 132* 134*  K 4.9 5.1 4.3 4.3 5.4*  CL 99* 99* 101 103 105  CO2 19* 19* 20* 21* 18*  GLUCOSE 104* 110* 121* 81 99  BUN 28* 29* 31* 34* 36*  CREATININE 2.21* 2.18* 1.96* 2.12* 1.87*  CALCIUM 8.9 8.8* 8.8* 8.6* 8.5*    Liver Function Tests: Recent Labs  Lab 04/22/17 0610  AST 68*  ALT 40  ALKPHOS 76  BILITOT 1.2  PROT 7.3  ALBUMIN 4.0   Recent Labs  Lab 04/22/17 0610 04/23/17 0748  LIPASE 70* 51   No results for input(s): AMMONIA in the last 168 hours. Coagulation Profile: No results for input(s): INR, PROTIME in the last 168 hours. Cardiac Enzymes: Recent Labs  Lab 04/22/17 0831 04/22/17 1300 04/25/17 0555  CKTOTAL  --   --  138  TROPONINI <0.03 <0.03  --    BNP (last 3 results) No results for input(s): PROBNP in the last 8760 hours. CBG: No results for input(s): GLUCAP in the last 168 hours. Studies: No results found.  Scheduled Meds: . aspirin EC  325 mg Oral Daily  . [START ON 04/26/2017] cloNIDine  0.1 mg Oral Daily  . feeding supplement  1 Container Oral TID BM  . feeding supplement (PRO-STAT SUGAR FREE 64)  30 mL Oral BID  . folic acid  1 mg Oral Daily  . heparin  5,000 Units Subcutaneous Q8H  . ipratropium  2 spray Each Nare QID  . multivitamin with minerals  1 tablet Oral Daily  . pantoprazole  40 mg Oral Daily  . pregabalin  600 mg Oral Daily  . thiamine  100 mg Oral Daily   Or  . thiamine  100 mg Intravenous Daily   Continuous Infusions: . sodium chloride 125 mL/hr at 04/25/17 0834   PRN Meds: acetaminophen, gi cocktail, HYDROcodone-acetaminophen, LORazepam, ondansetron (ZOFRAN) IV  Time spent: 35 minutes  Author: Lynden Oxford, MD [facility] Pager: [phone] 04/25/2017 4:45 PM  If 7PM-7AM, please contact night-coverage at [email or web address], password [facility]

## 2017-04-26 ENCOUNTER — Encounter (HOSPITAL_COMMUNITY): Payer: Self-pay | Admitting: *Deleted

## 2017-04-26 LAB — URINALYSIS, COMPLETE (UACMP) WITH MICROSCOPIC
BILIRUBIN URINE: NEGATIVE
Bacteria, UA: NONE SEEN
Glucose, UA: NEGATIVE mg/dL
Hgb urine dipstick: NEGATIVE
Ketones, ur: NEGATIVE mg/dL
LEUKOCYTES UA: NEGATIVE
NITRITE: NEGATIVE
PH: 5 (ref 5.0–8.0)
Protein, ur: NEGATIVE mg/dL
SPECIFIC GRAVITY, URINE: 1.01 (ref 1.005–1.030)

## 2017-04-26 LAB — BASIC METABOLIC PANEL
Anion gap: 9 (ref 5–15)
BUN: 24 mg/dL — ABNORMAL HIGH (ref 6–20)
CHLORIDE: 106 mmol/L (ref 101–111)
CO2: 23 mmol/L (ref 22–32)
CREATININE: 1.42 mg/dL — AB (ref 0.61–1.24)
Calcium: 8.5 mg/dL — ABNORMAL LOW (ref 8.9–10.3)
GFR calc non Af Amer: 50 mL/min — ABNORMAL LOW (ref >60)
GFR, EST AFRICAN AMERICAN: 58 mL/min — AB (ref >60)
Glucose, Bld: 100 mg/dL — ABNORMAL HIGH (ref 65–99)
POTASSIUM: 4.4 mmol/L (ref 3.5–5.1)
Sodium: 138 mmol/L (ref 135–145)

## 2017-04-26 MED ORDER — FOLIC ACID 1 MG PO TABS: 1.0000 mg | ORAL_TABLET | Freq: Every day | ORAL | 0 refills | Status: DC

## 2017-04-26 MED ORDER — BOOST BREEZE PO LIQD: 1.0000 | Freq: Two times a day (BID) | ORAL | 0 refills | Status: DC

## 2017-04-26 MED ORDER — THIAMINE HCL 100 MG PO TABS: 100.0000 mg | ORAL_TABLET | Freq: Every day | ORAL | 0 refills | Status: AC

## 2017-04-26 MED ORDER — PANTOPRAZOLE SODIUM 40 MG PO TBEC: 40.0000 mg | DELAYED_RELEASE_TABLET | Freq: Every day | ORAL | 0 refills | Status: DC

## 2017-04-26 MED ORDER — PREGABALIN 300 MG PO CAPS: 300.0000 mg | ORAL_CAPSULE | Freq: Two times a day (BID) | ORAL | 0 refills | Status: DC

## 2017-04-26 NOTE — Discharge Instructions (Signed)
Acute Kidney Injury, Adult Acute kidney injury is a sudden worsening of kidney function. The kidneys are organs that have several jobs. They filter the blood to remove waste products and extra fluid. They also maintain a healthy balance of minerals and hormones in the body, which helps control blood pressure and keep bones strong. With this condition, your kidneys do not do their jobs as well as they should. This condition ranges from mild to severe. Over time it may develop into long-lasting (chronic) kidney disease. Early detection and treatment may prevent acute kidney injury from developing into a chronic condition. What are the causes? Common causes of this condition include:  A problem with blood flow to the kidneys. This may be caused by: ? Low blood pressure (hypotension) or shock. ? Blood loss. ? Heart and blood vessel (cardiovascular) disease. ? Severe burns. ? Liver disease.  Direct damage to the kidneys. This may be caused by: ? Certain medicines. ? A kidney infection. ? Poisoning. ? Being around or in contact with toxic substances. ? A surgical wound. ? A hard, direct hit to the kidney area.  A sudden blockage of urine flow. This may be caused by: ? Cancer. ? Kidney stones. ? An enlarged prostate in males.  What are the signs or symptoms? Symptoms of this condition may not be obvious until the condition becomes severe. Symptoms of this condition can include:  Tiredness (lethargy), or difficulty staying awake.  Nausea or vomiting.  Swelling (edema) of the face, legs, ankles, or feet.  Problems with urination, such as: ? Abdominal pain, or pain along the side of your stomach (flank). ? Decreased urine production. ? Decrease in the force of urine flow.  Muscle twitches and cramps, especially in the legs.  Confusion or trouble concentrating.  Loss of appetite.  Fever.  How is this diagnosed? This condition may be diagnosed with tests, including:  Blood  tests.  Urine tests.  Imaging tests.  A test in which a sample of tissue is removed from the kidneys to be examined under a microscope (kidney biopsy).  How is this treated? Treatment for this condition depends on the cause and how severe the condition is. In mild cases, treatment may not be needed. The kidneys may heal on their own. In more severe cases, treatment will involve:  Treating the cause of the kidney injury. This may involve changing any medicines you are taking or adjusting your dosage.  Fluids. You may need specialized IV fluids to balance your body's needs.  Having a catheter placed to drain urine and prevent blockages.  Preventing problems from occurring. This may mean avoiding certain medicines or procedures that can cause further injury to the kidneys.  In some cases treatment may also require:  A procedure to remove toxic wastes from the body (dialysis or continuous renal replacement therapy - CRRT).  Surgery. This may be done to repair a torn kidney, or to remove the blockage from the urinary system.  Follow these instructions at home: Medicines  Take over-the-counter and prescription medicines only as told by your health care provider.  Do not take any new medicines without your health care provider's approval. Many medicines can worsen your kidney damage.  Do not take any vitamin and mineral supplements without your health care provider's approval. Many nutritional supplements can worsen your kidney damage. Lifestyle  If your health care provider prescribed changes to your diet, follow them. You may need to decrease the amount of protein you eat.  Achieve  and maintain a healthy weight. If you need help with this, ask your health care provider.  Start or continue an exercise plan. Try to exercise at least 30 minutes a day, 5 days a week.  Do not use any tobacco products, such as cigarettes, chewing tobacco, and e-cigarettes. If you need help quitting, ask  your health care provider. General instructions  Keep track of your blood pressure. Report changes in your blood pressure as told by your health care provider.  Stay up to date with immunizations. Ask your health care provider which immunizations you need.  Keep all follow-up visits as told by your health care provider. This is important. Where to find more information:  American Association of Kidney Patients: ResidentialShow.is  [employer]: www.kidney.org  [employer]: FightingMatch.com.ee  [facility]: ? www.lifeoptions.org ? www.kidneyschool.org Contact a health care provider if:  Your symptoms get worse.  You develop new symptoms. Get help right away if:  You develop symptoms of worsening kidney disease, which include: ? Headaches. ? Abnormally dark or light skin. ? Easy bruising. ? Frequent hiccups. ? Chest pain. ? Shortness of breath. ? End of menstruation in women. ? Seizures. ? Confusion or altered mental status. ? Abdominal or back pain. ? Itchiness.  You have a fever.  Your body is producing less urine.  You have pain or bleeding when you urinate. Summary  Acute kidney injury is a sudden worsening of kidney function.  Acute kidney injury can be caused by problems with blood flow to the kidneys, direct damage to the kidneys, and sudden blockage of urine flow.  Symptoms of this condition may not be obvious until it becomes severe. Symptoms may include edema, lethargy, confusion, nausea or vomiting, and problems passing urine.  This condition can usually be diagnosed with blood tests, urine tests, and imaging tests. Sometimes a kidney biopsy is done to diagnose this condition.  Treatment for this condition often involves treating the underlying cause. It is treated with fluids, medicines, dialysis, diet changes, or surgery. This information is not intended to replace advice given to you by your health care provider.  Make sure you discuss any questions you have with your health care provider. Document Released: 09/07/2010 Document Revised: 06/24/2016 Document Reviewed: 02/13/2016 Elsevier Interactive Patient Education  2018 [employer].  Alcohol Abuse and Nutrition Alcohol abuse is any pattern of alcohol consumption that harms your health, relationships, or work. Alcohol abuse can affect how your body breaks down and absorbs nutrients from food by causing your liver to work abnormally. Additionally, many people who abuse alcohol do not eat enough carbohydrates, protein, fat, vitamins, and minerals. This can cause poor nutrition (malnutrition) and a lack of nutrients (nutrient deficiencies), which can lead to further complications. Nutrients that are commonly lacking (deficient) among people who abuse alcohol include:  Vitamins. ? Vitamin A. This is stored in your liver. It is important for your vision, metabolism, and ability to fight off infections (immunity). ? B vitamins. These include vitamins such as folate, thiamin, and niacin. These are important in new cell growth and maintenance. ? Vitamin C. This plays an important role in iron absorption, wound healing, and immunity. ? Vitamin D. This is produced by your liver, but you can also get vitamin D from food. Vitamin D is necessary for your body to absorb and use calcium.  Minerals. ? Calcium. This is important for your bones and your heart and blood vessel (cardiovascular) function. ? Iron. This is important for  blood, muscle, and nervous system functioning. ? Magnesium. This plays an important role in muscle and nerve function, and it helps to control blood sugar and blood pressure. ? Zinc. This is important for the normal function of your nervous system and digestive system (gastrointestinal tract).  Nutrition is an essential component of therapy for alcohol abuse. Your health care provider or dietitian will work with you to design a plan that can  help restore nutrients to your body and prevent potential complications. What is my plan? Your dietitian may develop a specific diet plan that is based on your condition and any other complications you may have. A diet plan will commonly include:  A balanced diet. ? Grains: 6-8 oz per day. ? Vegetables: 2-3 cups per day. ? Fruits: 1-2 cups per day. ? Meat and other protein: 5-6 oz per day. ? Dairy: 2-3 cups per day.  Vitamin and mineral supplements.  What do I need to know about alcohol and nutrition?  Consume foods that are high in antioxidants, such as grapes, berries, nuts, green tea, and dark green and orange vegetables. This can help to counteract some of the stress that is placed on your liver by consuming alcohol.  Avoid food and drinks that are high in fat and sugar. Foods such as sugared soft drinks, salty snack foods, and candy contain empty calories. This means that they lack important nutrients such as protein, fiber, and vitamins.  Eat frequent meals and snacks. Try to eat 5-6 small meals each day.  Eat a variety of fresh fruits and vegetables each day. This will help you get plenty of water, fiber, and vitamins in your diet.  Drink plenty of water and other clear fluids. Try to drink at least 48-64 oz (1.5-2 L) of water per day.  If you are a vegetarian, eat a variety of protein-rich foods. Pair whole grains with plant-based proteins at meals and snacks to obtain the greatest nutrient benefit from your food. For example, eat rice with beans, put peanut butter on whole-grain toast, or eat oatmeal with sunflower seeds.  Soak beans and whole grains overnight before cooking. This can help your body to absorb the nutrients more easily.  Include foods fortified with vitamins and minerals in your diet. Commonly fortified foods include milk, orange juice, cereal, and bread.  If you are malnourished, your dietitian may recommend a high-protein, high-calorie diet. This may  include: ? 2,000-3,000 calories (kilocalories) per day. ? 70-100 grams of protein per day.  Your health care provider may recommend a complete nutritional supplement beverage. This can help to restore calories, protein, and vitamins to your body. Depending on your condition, you may be advised to consume this instead of or in addition to meals.  Limit your intake of caffeine. Replace drinks like coffee and black tea with decaffeinated coffee and herbal tea.  Eat a variety of foods that are high in omega fatty acids. These include fish, nuts and seeds, and soybeans. These foods may help your liver to recover and may also stabilize your mood.  Certain medicines may cause changes in your appetite, taste, and weight. Work with your health care provider and dietitian to make any adjustments to your medicines and diet plan.  Include other healthy lifestyle choices in your daily routine. ? Be physically active. ? Get enough sleep. ? Spend time doing activities that you enjoy.  If you are unable to take in enough food and calories by mouth, your health care provider may recommend a  feeding tube. This is a tube that passes through your nose and throat, directly into your stomach. Nutritional supplement beverages can be given to you through the feeding tube to help you get the nutrients you need.  Take vitamin or mineral supplements as recommended by your health care provider. What foods can I eat? Grains Enriched pasta. Enriched rice. Fortified whole-grain bread. Fortified whole-grain cereal. Barley. Brown rice. Quinoa. Millet. Vegetables All fresh, frozen, and canned vegetables. Spinach. Kale. Artichoke. Carrots. Winter squash and pumpkin. Sweet potatoes. Broccoli. Cabbage. Cucumbers. Tomatoes. Sweet peppers. Green beans. Peas. Corn. Fruits All fresh and frozen fruits. Berries. Grapes. Mango. Papaya. Guava. Cherries. Apples. Bananas. Peaches. Plums. Pineapple. Watermelon. Cantaloupe. Oranges.  Avocado. Meats and Other Protein Sources Beef liver. Lean beef. Pork. Fresh and canned chicken. Fresh fish. Oysters. Sardines. Canned tuna. Shrimp. Eggs with yolks. Nuts and seeds. Peanut butter. Beans and lentils. Soybeans. Tofu. Dairy Whole, low-fat, and nonfat milk. Whole, low-fat, and nonfat yogurt. Cottage cheese. Sour cream. Hard and soft cheeses. Beverages Water. Herbal tea. Decaffeinated coffee. Decaffeinated green tea. 100% fruit juice. 100% vegetable juice. Instant breakfast shakes. Condiments Ketchup. Mayonnaise. Mustard. Salad dressing. Barbecue sauce. Sweets and Desserts Sugar-free ice cream. Sugar-free pudding. Sugar-free gelatin. Fats and Oils Butter. Vegetable oil, flaxseed oil, olive oil, and walnut oil. Other Complete nutrition shakes. Protein bars. Sugar-free gum. The items listed above may not be a complete list of recommended foods or beverages. Contact your dietitian for more options. What foods are not recommended? Grains Sugar-sweetened breakfast cereals. Flavored instant oatmeal. Fried breads. Vegetables Breaded or deep-fried vegetables. Fruits Dried fruit with added sugar. Candied fruit. Canned fruit in syrup. Meats and Other Protein Sources Breaded or deep-fried meats. Dairy Flavored milks. Fried cheese curds or fried cheese sticks. Beverages Alcohol. Sugar-sweetened soft drinks. Sugar-sweetened tea. Caffeinated coffee and tea. Condiments Sugar. Honey. Agave nectar. Molasses. Sweets and Desserts Chocolate. Cake. Cookies. Candy. Other Potato chips. Pretzels. Salted nuts. Candied nuts. The items listed above may not be a complete list of foods and beverages to avoid. Contact your dietitian for more information. This information is not intended to replace advice given to you by your health care provider. Make sure you discuss any questions you have with your health care provider. Document Released: 12/17/2004 Document Revised: 07/02/2015 Document Reviewed:  09/25/2013 Elsevier Interactive Patient Education  2018 [employer]   Alcohol Use Disorder Alcohol use disorder is when your drinking disrupts your daily life. When you have this condition, you drink too much alcohol and you cannot control your drinking. Alcohol use disorder can cause serious problems with your physical health. It can affect your brain, heart, liver, pancreas, immune system, stomach, and intestines. Alcohol use disorder can increase your risk for certain cancers and cause problems with your mental health, such as depression, anxiety, psychosis, delirium, and dementia. People with this disorder risk hurting themselves and others. What are the causes? This condition is caused by drinking too much alcohol over time. It is not caused by drinking too much alcohol only one or two times. Some people with this condition drink alcohol to cope with or escape from negative life events. Others drink to relieve pain or symptoms of mental illness. What increases the risk? You are more likely to develop this condition if:  You have a family history of alcohol use disorder.  Your culture encourages drinking to the point of intoxication, or makes alcohol easy to get.  You had a mood or conduct disorder in childhood.  You have been  a victim of abuse.  You are an adolescent and: ? You have poor grades or difficulties in school. ? Your caregivers do not talk to you about saying no to alcohol, or supervise your activities. ? You are impulsive or you have trouble with self-control.  What are the signs or symptoms? Symptoms of this condition include:  Drinkingmore than you want to.  Drinking for longer than you want to.  Trying several times to drink less or to control your drinking.  Spending a lot of time getting alcohol, drinking, or recovering from drinking.  Craving alcohol.  Having problems at work, at school, or at home due to drinking.  Having problems in relationships  due to drinking.  Drinking when it is dangerous to drink, such as before driving a car.  Continuing to drink even though you know you might have a physical or mental problem related to drinking.  Needing more and more alcohol to get the same effect you want from the alcohol (building up tolerance).  Having symptoms of withdrawal when you stop drinking. Symptoms of withdrawal include: ? Fatigue. ? Nightmares. ? Trouble sleeping. ? Depression. ? Anxiety. ? Fever. ? Seizures. ? Severe confusion. ? Feeling or seeing things that are not there (hallucinations). ? Tremors. ? Rapid heart rate. ? Rapid breathing. ? High blood pressure.  Drinking to avoid symptoms of withdrawal.  How is this diagnosed? This condition is diagnosed with an assessment. Your health care provider may start the assessment by asking three or four questions about your drinking. Your health care provider may perform a physical exam or do lab tests to see if you have physical problems resulting from alcohol use. She or he may refer you to a mental health professional for evaluation. How is this treated? Some people with alcohol use disorder are able to reduce their alcohol use to low-risk levels. Others need to completely quit drinking alcohol. When necessary, mental health professionals with specialized training in substance use treatment can help. Your health care provider can help you decide how severe your alcohol use disorder is and what type of treatment you need. The following forms of treatment are available:  Detoxification. Detoxification involves quitting drinking and using prescription medicines within the first week to help lessen withdrawal symptoms. This treatment is important for people who have had withdrawal symptoms before and for heavy drinkers who are likely to have withdrawal symptoms. Alcohol withdrawal can be dangerous, and in severe cases, it can cause death. Detoxification may be provided in a  home, community, or primary care setting, or in a hospital or substance use treatment facility.  Counseling. This treatment is also called talk therapy. It is provided by substance use treatment counselors. A counselor can address the reasons you use alcohol and suggest ways to keep you from drinking again or to prevent problem drinking. The goals of talk therapy are to: ? Find healthy activities and ways for you to cope with stress. ? Identify and avoid the things that trigger your alcohol use. ? Help you learn how to handle cravings.  Medicines.Medicines can help treat alcohol use disorder by: ? Decreasing alcohol cravings. ? Decreasing the positive feeling you have when you drink alcohol. ? Causing an uncomfortable physical reaction when you drink alcohol (aversion therapy).  Support groups. Support groups are led by people who have quit drinking. They provide emotional support, advice, and guidance.  These forms of treatment are often combined. Some people with this condition benefit from a combination of  treatments provided by specialized substance use treatment centers. Follow these instructions at home:  Take over-the-counter and prescription medicines only as told by your health care provider.  Check with your health care provider before starting any new medicines.  Ask friends and family members not to offer you alcohol.  Avoid situations where alcohol is served, including gatherings where others are drinking alcohol.  Create a plan for what to do when you are tempted to use alcohol.  Find hobbies or activities that you enjoy that do not include alcohol.  Keep all follow-up visits as told by your health care provider. This is important. How is this prevented?  If you drink, limit alcohol intake to no more than 1 drink a day for nonpregnant women and 2 drinks a day for men. One drink equals 12 oz of beer, 5 oz of wine, or 1 oz of hard liquor.  If you have a mental health  condition, get treatment and support.  Do not give alcohol to adolescents.  If you are an adolescent: ? Do not drink alcohol. ? Do not be afraid to say no if someone offers you alcohol. Speak up about why you do not want to drink. You can be a positive role model for your friends and set a good example for those around you by not drinking alcohol. ? If your friends drink, spend time with others who do not drink alcohol. Make new friends who do not use alcohol. ? Find healthy ways to manage stress and emotions, such as meditation or deep breathing, exercise, spending time in nature, listening to music, or talking with a trusted friend or family member. Contact a health care provider if:  You are not able to take your medicines as told.  Your symptoms get worse.  You return to drinking alcohol (relapse) and your symptoms get worse. Get help right away if:  You have thoughts about hurting yourself or others. If you ever feel like you may hurt yourself or others, or have thoughts about taking your own life, get help right away. You can go to your nearest emergency department or call:  Your local emergency services (911 in the [place]  A suicide crisis helpline, such as the National Suicide Prevention Lifeline at 438-648-2411. This is open 24 hours a day.  Summary  Alcohol use disorder is when your drinking disrupts your daily life. When you have this condition, you drink too much alcohol and you cannot control your drinking.  Treatment may include detoxification, counseling, medicine, and support groups.  Ask friends and family members not to offer you alcohol. Avoid situations where alcohol is served.  Get help right away if you have thoughts about hurting yourself or others. This information is not intended to replace advice given to you by your health care provider. Make sure you discuss any questions you have with your health care provider. Document Released: 04/01/2004 Document  Revised: 11/20/2015 Document Reviewed: 11/20/2015 Elsevier Interactive Patient Education  [employer].

## 2017-04-26 NOTE — Clinical Social Work Note (Signed)
Clinical Social Work Assessment  Patient Details  Name: Howard Becker MRN: [id number] Date of Birth: [birth year]  Date of referral:  04/26/17               Reason for consult:  Facility Placement, Discharge Planning                Permission sought to share information with:  Facility Art therapist granted to share information::  No  Name::        Agency::     Relationship::     Contact Information:     Housing/Transportation Living arrangements for the past 2 months:  Apartment Source of Information:  Patient, Medical Team Patient Interpreter Needed:  None Criminal Activity/Legal Involvement Pertinent to Current Situation/Hospitalization:  No - Comment as needed Significant Relationships:  Spouse Lives with:  Spouse Do you feel safe going back to the place where you live?  Yes Need for family participation in patient care:  Yes (Comment)  Care giving concerns:  Substance abuse.   Social Worker assessment / plan:  CSW met with patient. No supports at bedside. CSW introduced role and inquired about interest in substance abuse resources. Patient is agreeable. CSW provided packet for local inpatient and outpatient treatment options. No further concerns. CSW signing off as social work intervention is no longer needed. Patient is discharging home today.  Employment status:  Retired Forensic scientist:  Medicare PT Recommendations:  Not assessed at this time Information / Referral to community resources:  Residential Substance Abuse Treatment Options, Outpatient Substance Abuse Treatment Options  Patient/Family's Response to care:  Patient agreeable to receiving resources. Patient's wife supportive and involved in patient's care. Patient appreciated social work intervention.  Patient/Family's Understanding of and Emotional Response to Diagnosis, Current Treatment, and Prognosis:  Patient has a good understanding of the reason for admission and social work  consult. Patient appears happy with hospital care.  Emotional Assessment Appearance:  Appears stated age Attitude/Demeanor/Rapport:  Engaged, Gracious Affect (typically observed):  Accepting, Appropriate, Calm, Pleasant Orientation:  Oriented to Self, Oriented to Place, Oriented to  Time, Oriented to Situation Alcohol / Substance use:  Alcohol Use Psych involvement (Current and /or in the community):  No (Comment)  Discharge Needs  Concerns to be addressed:  Care Coordination Readmission within the last 30 days:  No Current discharge risk:  Substance Abuse Barriers to Discharge:  No Barriers Identified   Candie Chroman, LCSW 04/26/2017, 9:58 AM

## 2017-04-28 NOTE — Discharge Summary (Signed)
[facility] Discharge Summary   Patient: Howard Becker YHC:[id number]   PCP: Leilani Able, MD DOB: 11-14-1950   Date of admission: 04/22/2017   Date of discharge: 04/26/2017   Discharge Diagnoses:  Active Problems:   Chest pain   Hyponatremia   Abnormal serum level of lipase   Acute kidney injury (HCC)   Hypertension   ETOH abuse   Admitted From: home Disposition:  home  Recommendations for Outpatient Follow-up:  1. Please follow-up with PCP in 1 week. 2. Recommended stopped abusing alcohol.  Follow-up Information    Leilani Able, MD. Go on 05/04/2017.   Specialty:  Family Medicine Why:  @10 :30am Contact information: [address] [place] [address] [phone]          Diet recommendation: Cardiac diet  Activity: The patient is advised to gradually reintroduce usual activities.  Discharge Condition: good  Code Status: full code  History of present illness: As per the H and P dictated on admission, "Howard Becker is a 67 y.o. male with medical history significant for EtOH use, hypertension, chronic pain presents emergency Department chief complaint chest pain. Initial evaluation reveals hyponatremia acute kidney injury and uncontrolled hypertension. [facility] are asked to admit  Information is obtained from the patient and the chart. He complains of left-sided chest pain ongoing for approximately one week. He describes the pain as sharp located left anterior nonradiating. Associated symptoms include nausea with vomiting shortness of breath with exertion. He states he ran out of his blood pressure medications yesterday has not been able to fill them due to finances. States he saw his PCP a month ago. Reports she tried drinking beer to address his chest pain and had 9 cans of beer before coming to the hospital today. Reports typically he drinks 5-6 cans of beer. He denies headache dizziness syncope or near-syncope. He denies abdominal pain diarrhea  constipation melena bright red blood per rectum. He denies dysuria hematuria frequency or urgency. He denies any lower extremity edema recent travel or sick contacts. He denies a seizure disorder but confirms home medications include Keppra."  Hospital Course:  Summary of his active problems in the hospital is as following. #1. Chest pain. Atypical.Heart score 4.  Troponins are negative. EKG nonischemic as well. Based on the HPI chest pain appears to be GI origin. Echocardiogram performed, EF 40-45%. Earlier provider discussed with cardiology recommend no inpatient workup.  Recommendation was to follow-up with primary care physician as an outpatient for this Continue PPI.  #2. Hyponatremia. Serum sodium 123 on admission. Likely related to increased EtOH consumption of late.Received 2 L of normal saline in the emergency departmentAsymptomatic Sodium normal.  3. Acute kidney injury.  Likely related to decreased oral intake in the setting of increased EtOH consumption.Creatinine 2.68 in the emergency department. Most recent noted in chart over a year ago it was within the limits of normal Renal ultrasound normal.  Urine output normal. Function close to baseline.  Recommend oral hydration and discharge.  #4. Elevated lipase. Mild elevation of his lipase noted and workup. Likely related to EtOH abuse.No abdominal pain mild nausea no vomiting Tolerating oral diet.  #5. Hypertension. Discontinue diuretics on discharge. Blood pressure well controlled without them.  #6. EtOH use. Monitored on CIWA protocol. No withdrawal in the hospital  #7 chronic pain -Contlyrica - prn vicodin  All other chronic medical condition were stable during the hospitalization.  Patient was ambulatory without any assistance. On the day of the discharge the patient's  vitals were stable , and no other acute medical condition were reported by patient. the patient was felt safe to be discharge at home  with family.  Procedures and Results:  none   Consultations:  none  DISCHARGE MEDICATION: Allergies as of 04/26/2017   No Known Allergies     Medication List    STOP taking these medications   triamterene-hydrochlorothiazide 37.5-25 MG tablet Commonly known as:  MAXZIDE-25     TAKE these medications   feeding supplement (BOOST BREEZE) Liqd Take 1 Can by mouth 2 (two) times daily.   folic acid 1 MG tablet Commonly known as:  FOLVITE Take 1 tablet (1 mg total) by mouth daily.   multivitamin with minerals Tabs tablet Take 2 tablets by mouth every evening.   pantoprazole 40 MG tablet Commonly known as:  PROTONIX Take 1 tablet (40 mg total) by mouth daily.   pregabalin 300 MG capsule Commonly known as:  LYRICA Take 1 capsule (300 mg total) by mouth 2 (two) times daily.   thiamine 100 MG tablet Take 1 tablet (100 mg total) by mouth daily.   traMADol 50 MG tablet Commonly known as:  ULTRAM Take 100 mg by mouth every 6 (six) hours.   VITAMIN B-12 PO Take 1 tablet by mouth every evening.   vitamin C 1000 MG tablet Take 2,000 mg by mouth daily.      No Known Allergies Discharge Instructions    Diet - low sodium heart healthy   Complete by:  As directed    Discharge instructions   Complete by:  As directed    It is important that you read following instructions as well as go over your medication list with RN to help you understand your care after this hospitalization.  Discharge Instructions: Please follow-up with PCP in one week  Please request your primary care physician to go over all Hospital Tests and Procedure/Radiological results at the follow up,  Please get all Hospital records sent to your PCP by signing hospital release before you go home.   Do not drive, operating heavy machinery, perform activities at heights, swimming or participation in water activities or provide baby sitting services while you are on Pain, Sleep and Anxiety Medications; until  you have been seen by Primary Care Physician or a Neurologist and advised to do so again. Do not take more than prescribed Pain, Sleep and Anxiety Medications. You were cared for by a hospitalist during your hospital stay. If you have any questions about your discharge medications or the care you received while you were in the hospital after you are discharged, you can call the unit and ask to speak with the hospitalist on call if the hospitalist that took care of you is not available.  Once you are discharged, your primary care physician will handle any further medical issues. Please note that NO REFILLS for any discharge medications will be authorized once you are discharged, as it is imperative that you return to your primary care physician (or establish a relationship with a primary care physician if you do not have one) for your aftercare needs so that they can reassess your need for medications and monitor your lab values. You Must read complete instructions/literature along with all the possible adverse reactions/side effects for all the Medicines you take and that have been prescribed to you. Take any new Medicines after you have completely understood and accept all the possible adverse reactions/side effects. Wear Seat belts while driving. If you have  smoked or chewed Tobacco in the last 2 yrs please stop smoking and/or stop any Recreational drug use.   Increase activity slowly   Complete by:  As directed      Discharge Exam: Filed Weights   04/24/17 0605 04/25/17 0649 04/26/17 0500  Weight: 84.9 kg (187 lb 3.2 oz) 89.4 kg (197 lb) 88.6 kg (195 lb 6.4 oz)   Vitals:   04/25/17 2047 04/26/17 0500  BP: 102/64 130/83  Pulse: 71 76  Resp: 18 18  Temp: 98 F (36.7 C) 97.7 F (36.5 C)  SpO2: 98% 98%   General: Appear in no distress, no Rash; Oral Mucosa moist. Cardiovascular: S1 and S2 Present, no Murmur, no JVD Respiratory: Bilateral Air entry present and Clear to Auscultation, no  Crackles, no wheezes Abdomen: Bowel Sound present, Soft and no tenderness Extremities: no Pedal edema, n calf tenderness Neurology: Grossly no focal neuro deficit.  The results of significant diagnostics from this hospitalization (including imaging, microbiology, ancillary and laboratory) are listed below for reference.    Significant Diagnostic Studies: Dg Chest 2 View  Result Date: 04/22/2017 CLINICAL DATA:  Chest pain. EXAM: CHEST  2 VIEW COMPARISON:  Radiographs 03/26/2012 FINDINGS: Chronic hyperinflation and interstitial coarsening. Unchanged heart size and mediastinal contours. No consolidation. No pulmonary edema, pleural effusion or pneumothorax. No acute osseous abnormalities. IMPRESSION: Chronic hyperinflation and interstitial thickening. No acute abnormality. Electronically Signed   By: Rubye Oaks M.D.   On: 04/22/2017 05:40   US Renal  Result Date: 04/23/2017 CLINICAL DATA:  Acute renal injury 04/22/2017. EXAM: RENAL / URINARY TRACT ULTRASOUND COMPLETE COMPARISON:  CT 04/05/2015 FINDINGS: Right Kidney: Length: 11.0 cm. Echogenicity within normal limits. No mass or hydronephrosis visualized. Left Kidney: Length: 11.5 cm. Echogenicity within normal limits. No mass or hydronephrosis visualized. Bladder: Appears normal for degree of bladder distention. IMPRESSION: Normal renal ultrasound. Electronically Signed   By: Elberta Fortis M.D.   On: 04/23/2017 09:12    Microbiology: No results found for this or any previous visit (from the past 240 hour(s)).   Labs: CBC: Recent Labs  Lab 04/22/17 0515 04/22/17 0831 04/23/17 0454  WBC 8.6 9.0 6.9  HGB 14.5 13.6 13.5  HCT 40.3 37.8* 38.2*  MCV 86.1 84.9 88.0  PLT 281 260 252   Basic Metabolic Panel: Recent Labs  Lab 04/24/17 0723 04/24/17 1354 04/25/17 0555 04/25/17 2153 04/26/17 0742  NA 131* 132* 134* 138 138  K 4.3 4.3 5.4* 4.0 4.4  CL 101 103 105 106 106  CO2 20* 21* 18* 22 23  GLUCOSE 121* 81 99 81 100*  BUN 31*  34* 36* 28* 24*  CREATININE 1.96* 2.12* 1.87* 1.60* 1.42*  CALCIUM 8.8* 8.6* 8.5* 8.6* 8.5*   Liver Function Tests: Recent Labs  Lab 04/22/17 0610  AST 68*  ALT 40  ALKPHOS 76  BILITOT 1.2  PROT 7.3  ALBUMIN 4.0   Recent Labs  Lab 04/22/17 0610 04/23/17 0748  LIPASE 70* 51   No results for input(s): AMMONIA in the last 168 hours. Cardiac Enzymes: Recent Labs  Lab 04/22/17 0831 04/22/17 1300 04/25/17 0555  CKTOTAL  --   --  138  TROPONINI <0.03 <0.03  --    BNP (last 3 results) No results for input(s): BNP in the last 8760 hours. CBG: No results for input(s): GLUCAP in the last 168 hours. Time spent: 35 minutes  Signed:  Lynden Oxford  [facility] 04/26/2017 , 6:20 PM

## 2019-03-20 ENCOUNTER — Ambulatory Visit
Admission: RE | Admit: 2019-03-20 | Discharge: 2019-03-20 | Disposition: A | Discharge status: Home / Self Care | Payer: Medicare Other | Source: Ambulatory Visit | Attending: Family Medicine | Admitting: Family Medicine

## 2019-03-20 ENCOUNTER — Other Ambulatory Visit: Payer: Self-pay | Admitting: Family Medicine

## 2019-03-20 ENCOUNTER — Other Ambulatory Visit: Payer: Self-pay

## 2019-03-20 DIAGNOSIS — W19XXXA Unspecified fall, initial encounter: Secondary | ICD-10-CM

## 2019-04-06 ENCOUNTER — Ambulatory Visit
Admission: RE | Admit: 2019-04-06 | Discharge: 2019-04-06 | Disposition: A | Discharge status: Home / Self Care | Payer: Medicare Other | Source: Ambulatory Visit | Attending: Family Medicine | Admitting: Family Medicine

## 2019-04-06 ENCOUNTER — Other Ambulatory Visit: Payer: Self-pay | Admitting: Family Medicine

## 2019-04-06 ENCOUNTER — Other Ambulatory Visit: Payer: Self-pay

## 2019-04-06 DIAGNOSIS — R52 Pain, unspecified: Secondary | ICD-10-CM

## 2019-08-01 ENCOUNTER — Telehealth: Payer: Self-pay

## 2019-08-01 NOTE — Telephone Encounter (Signed)
Recv'd records from [facility] forwarded 8 pages to [facility] 5/26/21fbg

## 2020-01-09 ENCOUNTER — Other Ambulatory Visit: Payer: Self-pay | Admitting: Pain Medicine

## 2020-01-09 DIAGNOSIS — G8929 Other chronic pain: Secondary | ICD-10-CM

## 2020-01-09 DIAGNOSIS — M545 Low back pain, unspecified: Secondary | ICD-10-CM

## 2020-01-30 ENCOUNTER — Other Ambulatory Visit: Payer: Self-pay

## 2020-01-30 ENCOUNTER — Other Ambulatory Visit: Payer: Self-pay | Admitting: Pain Medicine

## 2020-01-30 ENCOUNTER — Ambulatory Visit
Admission: RE | Admit: 2020-01-30 | Discharge: 2020-01-30 | Disposition: A | Discharge status: Home / Self Care | Payer: Medicare Other | Source: Ambulatory Visit | Attending: Pain Medicine | Admitting: Pain Medicine

## 2020-01-30 DIAGNOSIS — M25512 Pain in left shoulder: Secondary | ICD-10-CM

## 2020-02-10 ENCOUNTER — Ambulatory Visit
Admission: RE | Admit: 2020-02-10 | Discharge: 2020-02-10 | Disposition: A | Discharge status: Home / Self Care | Payer: Medicare Other | Source: Ambulatory Visit | Attending: Pain Medicine | Admitting: Pain Medicine

## 2020-02-10 DIAGNOSIS — G8929 Other chronic pain: Secondary | ICD-10-CM

## 2020-02-10 DIAGNOSIS — M545 Low back pain, unspecified: Secondary | ICD-10-CM

## 2020-02-11 ENCOUNTER — Other Ambulatory Visit: Payer: Self-pay | Admitting: Pain Medicine

## 2020-02-11 DIAGNOSIS — G8929 Other chronic pain: Secondary | ICD-10-CM

## 2020-03-05 ENCOUNTER — Other Ambulatory Visit: Payer: Self-pay

## 2020-03-05 ENCOUNTER — Ambulatory Visit
Admission: RE | Admit: 2020-03-05 | Discharge: 2020-03-05 | Disposition: A | Discharge status: Home / Self Care | Payer: Medicare Other | Source: Ambulatory Visit | Attending: Pain Medicine | Admitting: Pain Medicine

## 2020-03-05 DIAGNOSIS — G8929 Other chronic pain: Secondary | ICD-10-CM

## 2020-07-08 ENCOUNTER — Other Ambulatory Visit: Payer: Self-pay | Admitting: Internal Medicine

## 2020-07-08 DIAGNOSIS — I8001 Phlebitis and thrombophlebitis of superficial vessels of right lower extremity: Secondary | ICD-10-CM

## 2020-07-09 ENCOUNTER — Ambulatory Visit
Admission: RE | Admit: 2020-07-09 | Discharge: 2020-07-09 | Disposition: A | Discharge status: Home / Self Care | Payer: Medicare Other | Source: Ambulatory Visit | Attending: Internal Medicine | Admitting: Internal Medicine

## 2020-07-09 DIAGNOSIS — I8001 Phlebitis and thrombophlebitis of superficial vessels of right lower extremity: Secondary | ICD-10-CM

## 2020-11-25 ENCOUNTER — Emergency Department (HOSPITAL_COMMUNITY)
Admission: EM | Admit: 2020-11-25 | Discharge: 2020-11-26 | Disposition: A | Discharge status: Home / Self Care | Payer: Medicare Other | Attending: Emergency Medicine | Admitting: Emergency Medicine

## 2020-11-25 ENCOUNTER — Other Ambulatory Visit: Payer: Self-pay

## 2020-11-25 DIAGNOSIS — R410 Disorientation, unspecified: Secondary | ICD-10-CM | POA: Insufficient documentation

## 2020-11-25 DIAGNOSIS — I10 Essential (primary) hypertension: Secondary | ICD-10-CM | POA: Diagnosis not present

## 2020-11-25 DIAGNOSIS — M545 Low back pain, unspecified: Secondary | ICD-10-CM | POA: Insufficient documentation

## 2020-11-25 DIAGNOSIS — R519 Headache, unspecified: Secondary | ICD-10-CM | POA: Diagnosis present

## 2020-11-25 DIAGNOSIS — Z5321 Procedure and treatment not carried out due to patient leaving prior to being seen by health care provider: Secondary | ICD-10-CM | POA: Insufficient documentation

## 2020-11-25 DIAGNOSIS — G8929 Other chronic pain: Secondary | ICD-10-CM | POA: Insufficient documentation

## 2020-11-25 LAB — COMPREHENSIVE METABOLIC PANEL
ALT: 25 U/L (ref 0–44)
AST: 31 U/L (ref 15–41)
Albumin: 3.9 g/dL (ref 3.5–5.0)
Alkaline Phosphatase: 62 U/L (ref 38–126)
Anion gap: 8 (ref 5–15)
BUN: 12 mg/dL (ref 8–23)
CO2: 23 mmol/L (ref 22–32)
Calcium: 9.5 mg/dL (ref 8.9–10.3)
Chloride: 110 mmol/L (ref 98–111)
Creatinine, Ser: 1.1 mg/dL (ref 0.61–1.24)
GFR, Estimated: >60 mL/min (ref >60)
Glucose, Bld: 103 mg/dL — ABNORMAL HIGH (ref 70–99)
Potassium: 3.6 mmol/L (ref 3.5–5.1)
Sodium: 141 mmol/L (ref 135–145)
Total Bilirubin: 1.6 mg/dL — ABNORMAL HIGH (ref 0.3–1.2)
Total Protein: 7.2 g/dL (ref 6.5–8.1)

## 2020-11-25 LAB — CBC
HCT: 38.6 % — ABNORMAL LOW (ref 39.0–52.0)
Hemoglobin: 13.1 g/dL (ref 13.0–17.0)
MCH: 30.6 pg (ref 26.0–34.0)
MCHC: 33.9 g/dL (ref 30.0–36.0)
MCV: 90.2 fL (ref 80.0–100.0)
Platelets: 222 10*3/uL (ref 150–400)
RBC: 4.28 MIL/uL (ref 4.22–5.81)
RDW: 14.6 % (ref 11.5–15.5)
WBC: 8.6 10*3/uL (ref 4.0–10.5)
nRBC: 0 % (ref 0.0–0.2)

## 2020-11-25 NOTE — ED Triage Notes (Addendum)
Pt arrived complaining of HTN and headache Pt states his BP was all over the place today on his home monitor   Denies blurry vision and feeling lightheaded/dizzy   Pt denies HTN hx but has been on new pain medications so he has been tracking it  Pt chronic back pain , multiple surgeries   Pt state that this morning and over the past couple of days he has been confused but states he isnt anymore

## 2020-11-25 NOTE — ED Provider Notes (Signed)
Emergency Medicine Provider Triage Evaluation Note  Howard Becker , a 70 y.o. male  was evaluated in triage.  Pt complains of HTN and headache for several days. States he has been checking his BP at home and it has been reading "high". Intermittent feeling dizzy and confused. Has been taking tylenol and percocet for back pain, wondering if this is giving him a headache. Does not take medication for HTN. States he is not feeling dizzy or confused right now.  Review of Systems  Positive: HTN, dizziness, headache Negative: CP, SOB  Physical Exam  BP (!) 150/102 (BP Location: Left Arm)   Pulse 84   Temp 98.5 F (36.9 C) (Oral)   Resp 16   Ht 6\' 1"  (1.854 m)   Wt 83 kg   SpO2 95%   BMI 24.14 kg/m  Gen:   Awake, no distress   Resp:  Normal effort  MSK:   Moves extremities without difficulty  Other:    Medical Decision Making  Medically screening exam initiated at 6:40 PM.  Appropriate orders placed.  JASN XIA was informed that the remainder of the evaluation will be completed by another provider, this initial triage assessment does not replace that evaluation, and the importance of remaining in the ED until their evaluation is complete.     Guerry Bruin 11/25/20 1842    [place], 11/27/20, MD 11/25/20 1901

## 2020-11-26 ENCOUNTER — Ambulatory Visit
Admission: RE | Admit: 2020-11-26 | Discharge: 2020-11-26 | Disposition: A | Discharge status: Home / Self Care | Payer: Medicare Other | Source: Ambulatory Visit | Attending: Family Medicine | Admitting: Family Medicine

## 2020-11-26 ENCOUNTER — Other Ambulatory Visit: Payer: Self-pay | Admitting: Family Medicine

## 2020-11-26 DIAGNOSIS — R1032 Left lower quadrant pain: Secondary | ICD-10-CM

## 2020-11-26 NOTE — ED Notes (Signed)
Pt name called x3 for vitals; no response.

## 2020-11-27 ENCOUNTER — Other Ambulatory Visit: Payer: Self-pay | Admitting: Family Medicine

## 2020-11-27 DIAGNOSIS — R519 Headache, unspecified: Secondary | ICD-10-CM

## 2020-11-27 DIAGNOSIS — R410 Disorientation, unspecified: Secondary | ICD-10-CM

## 2020-11-27 DIAGNOSIS — R634 Abnormal weight loss: Secondary | ICD-10-CM

## 2020-11-28 ENCOUNTER — Ambulatory Visit
Admission: RE | Admit: 2020-11-28 | Discharge: 2020-11-28 | Disposition: A | Discharge status: Home / Self Care | Payer: Medicare Other | Source: Ambulatory Visit | Attending: Family Medicine | Admitting: Family Medicine

## 2020-11-28 ENCOUNTER — Other Ambulatory Visit: Payer: Self-pay

## 2020-11-28 ENCOUNTER — Other Ambulatory Visit: Payer: Self-pay | Admitting: Family Medicine

## 2020-11-28 DIAGNOSIS — R519 Headache, unspecified: Secondary | ICD-10-CM

## 2020-11-28 DIAGNOSIS — R410 Disorientation, unspecified: Secondary | ICD-10-CM

## 2020-11-28 DIAGNOSIS — R634 Abnormal weight loss: Secondary | ICD-10-CM

## 2020-11-28 MED ORDER — IOPAMIDOL (ISOVUE-300) INJECTION 61%
100.0000 mL | Freq: Once | INTRAVENOUS | Status: AC | PRN
  Administered 2020-11-28: 100 mL via INTRAVENOUS

## 2020-12-01 ENCOUNTER — Other Ambulatory Visit: Payer: Self-pay | Admitting: Family Medicine

## 2020-12-01 DIAGNOSIS — R519 Headache, unspecified: Secondary | ICD-10-CM

## 2020-12-01 DIAGNOSIS — G40909 Epilepsy, unspecified, not intractable, without status epilepticus: Secondary | ICD-10-CM

## 2020-12-01 DIAGNOSIS — R109 Unspecified abdominal pain: Secondary | ICD-10-CM

## 2020-12-01 DIAGNOSIS — R634 Abnormal weight loss: Secondary | ICD-10-CM

## 2020-12-01 DIAGNOSIS — R410 Disorientation, unspecified: Secondary | ICD-10-CM

## 2021-05-15 DIAGNOSIS — M75102 Unspecified rotator cuff tear or rupture of left shoulder, not specified as traumatic: Secondary | ICD-10-CM | POA: Insufficient documentation

## 2021-08-08 ENCOUNTER — Emergency Department (HOSPITAL_BASED_OUTPATIENT_CLINIC_OR_DEPARTMENT_OTHER)
Admission: EM | Admit: 2021-08-08 | Discharge: 2021-08-08 | Disposition: A | Discharge status: Home / Self Care | Payer: Medicare Other | Attending: Emergency Medicine | Admitting: Emergency Medicine

## 2021-08-08 ENCOUNTER — Other Ambulatory Visit: Payer: Self-pay

## 2021-08-08 ENCOUNTER — Emergency Department (HOSPITAL_BASED_OUTPATIENT_CLINIC_OR_DEPARTMENT_OTHER): Payer: Medicare Other | Admitting: Radiology

## 2021-08-08 ENCOUNTER — Encounter (HOSPITAL_BASED_OUTPATIENT_CLINIC_OR_DEPARTMENT_OTHER): Payer: Self-pay

## 2021-08-08 DIAGNOSIS — S43101A Unspecified dislocation of right acromioclavicular joint, initial encounter: Secondary | ICD-10-CM | POA: Diagnosis not present

## 2021-08-08 DIAGNOSIS — S4991XA Unspecified injury of right shoulder and upper arm, initial encounter: Secondary | ICD-10-CM | POA: Diagnosis present

## 2021-08-08 DIAGNOSIS — I10 Essential (primary) hypertension: Secondary | ICD-10-CM | POA: Insufficient documentation

## 2021-08-08 MED ORDER — OXYCODONE-ACETAMINOPHEN 5-325 MG PO TABS
2.0000 | ORAL_TABLET | Freq: Once | ORAL | Status: AC
  Administered 2021-08-08: 2 via ORAL
  Filled 2021-08-08: qty 2

## 2021-08-08 MED ORDER — OXYCODONE-ACETAMINOPHEN 10-325 MG PO TABS: 1.0000 | ORAL_TABLET | Freq: Four times a day (QID) | ORAL | 0 refills | Status: DC | PRN
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
## 2021-08-08 NOTE — ED Notes (Signed)
Dc instructions reviewed with patient. Patient voiced understanding. Dc with belongings.  °

## 2021-08-08 NOTE — Discharge Instructions (Addendum)
You were seen today for a right shoulder injury. Your AC joint appears to be separated. I have placed you in a sling for comfort. I did provide a short course of pain medication. I recommend that you follow up with pain management for further pain control needs. Follow up with Dr.Supple if the shoulder fails to improve

## 2021-12-29 ENCOUNTER — Ambulatory Visit: Payer: Medicare Other | Admitting: Podiatry

## 2021-12-29 ENCOUNTER — Encounter: Payer: Self-pay | Admitting: Podiatry

## 2021-12-29 DIAGNOSIS — M7741 Metatarsalgia, right foot: Secondary | ICD-10-CM

## 2021-12-29 DIAGNOSIS — M2041 Other hammer toe(s) (acquired), right foot: Secondary | ICD-10-CM

## 2021-12-29 DIAGNOSIS — M2042 Other hammer toe(s) (acquired), left foot: Secondary | ICD-10-CM | POA: Diagnosis not present

## 2021-12-29 DIAGNOSIS — G8929 Other chronic pain: Secondary | ICD-10-CM

## 2021-12-29 DIAGNOSIS — M7742 Metatarsalgia, left foot: Secondary | ICD-10-CM

## 2021-12-29 DIAGNOSIS — M79673 Pain in unspecified foot: Secondary | ICD-10-CM

## 2021-12-29 MED ORDER — DICLOFENAC SODIUM 75 MG PO TBEC: 75.0000 mg | DELAYED_RELEASE_TABLET | Freq: Two times a day (BID) | ORAL | 0 refills | Status: DC | PRN

## 2021-12-29 MED ORDER — CLOTRIMAZOLE-BETAMETHASONE 1-0.05 % EX CREA: 1.0000 | TOPICAL_CREAM | Freq: Two times a day (BID) | CUTANEOUS | 0 refills | Status: DC

## 2021-12-29 NOTE — Progress Notes (Signed)
Subjective:  Patient ID: Howard Becker, male    DOB: [birth year],  MRN: [id number]  Chief Complaint  Patient presents with   Foot Pain    Plantar forefoot bilateral - callused areas x years, tries toe trim, sometimes turn into blisters   New Patient (Initial Visit)    ABN signed for calluses    71 y.o. male presents with the above complaint. History confirmed with patient.  Also has dry peeling skin.  He denies that he has used any salicylic acid or treatment such as this  Objective:  Physical Exam: warm, good capillary refill, no trophic changes or ulcerative lesions, normal DP and PT pulses, normal sensory exam, and multiple hyperkeratotic lesions submetatarsal 1 and 5 bilateral there is thinning of the metatarsal fat pad, there is a macerated white appearance to the lesions that appears to be consistent with application of salicylic acid, dry peeling skin consistent with tinea pedis bilaterally.  Assessment:   1. Metatarsalgia of both feet   2. Hammertoe of left foot   3. Hammertoe of right foot   4. Chronic foot pain, unspecified laterality      Plan:  Patient was evaluated and treated and all questions answered.  We discussed etiology and treatment options of such lesions as well as metatarsalgia in general including thinning of the metatarsal fat pad and treatment of this including offloading padding orthotics and cream such as urea.  We discussed debridement of the lesions as well and that this is him that we can do although he would need to pay out-of-pocket for this due to Medicare guidelines as being nonqualified foot care for him.  He declined this.  He also inquired multiple times about narcotic pain medication he kept asking about tramadol, I did review his PDMP he recently had a 1 month supply of Percocet from pain management.  I suggested he continue any pain management to go through them.  I did prescribe him Lotrisone cream for the skin as well as Voltaren to take only  as needed.  He will return as needed.  Metatarsal pads were dispensed for extra cushioning on the forefoot  Return if symptoms worsen or fail to improve.

## 2021-12-29 NOTE — Patient Instructions (Signed)
Metatarsal pads can give you additional cushioning on the ball of the foot. These may be found at the pharmacy or online such as [place].

## 2022-01-01 ENCOUNTER — Telehealth: Payer: Self-pay

## 2022-01-04 NOTE — Telephone Encounter (Signed)
Noted, thanks!

## 2022-05-07 ENCOUNTER — Other Ambulatory Visit: Payer: Self-pay | Admitting: Neurological Surgery

## 2022-05-07 ENCOUNTER — Encounter: Payer: Self-pay | Admitting: Neurological Surgery

## 2022-05-07 DIAGNOSIS — M5416 Radiculopathy, lumbar region: Secondary | ICD-10-CM

## 2022-05-08 ENCOUNTER — Ambulatory Visit
Admission: RE | Admit: 2022-05-08 | Discharge: 2022-05-08 | Disposition: A | Discharge status: Home / Self Care | Payer: Medicare HMO | Source: Ambulatory Visit | Attending: Neurological Surgery | Admitting: Neurological Surgery

## 2022-05-08 DIAGNOSIS — M5416 Radiculopathy, lumbar region: Secondary | ICD-10-CM

## 2022-11-20 IMAGING — DX DG SHOULDER 2+V*R*
3 series · 3 of 3 positions shown · non-contrast
Comparison: None Available.

CLINICAL DATA: Pain after fall.

EXAM:
RIGHT SHOULDER - 2+ VIEW

[shoulder grashey (1 of 2)]
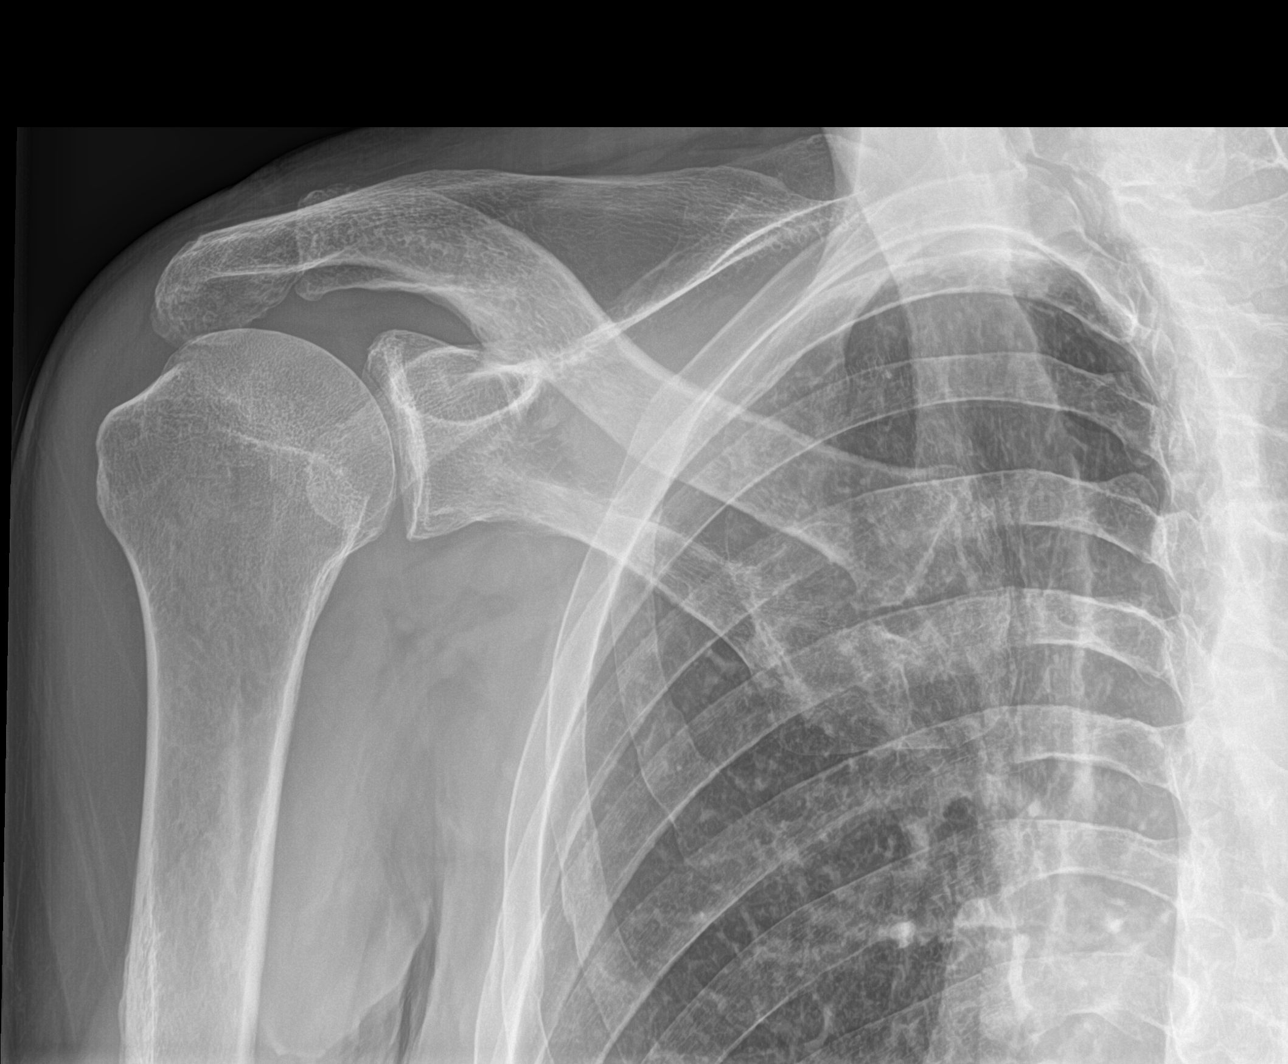

[shoulder y view]
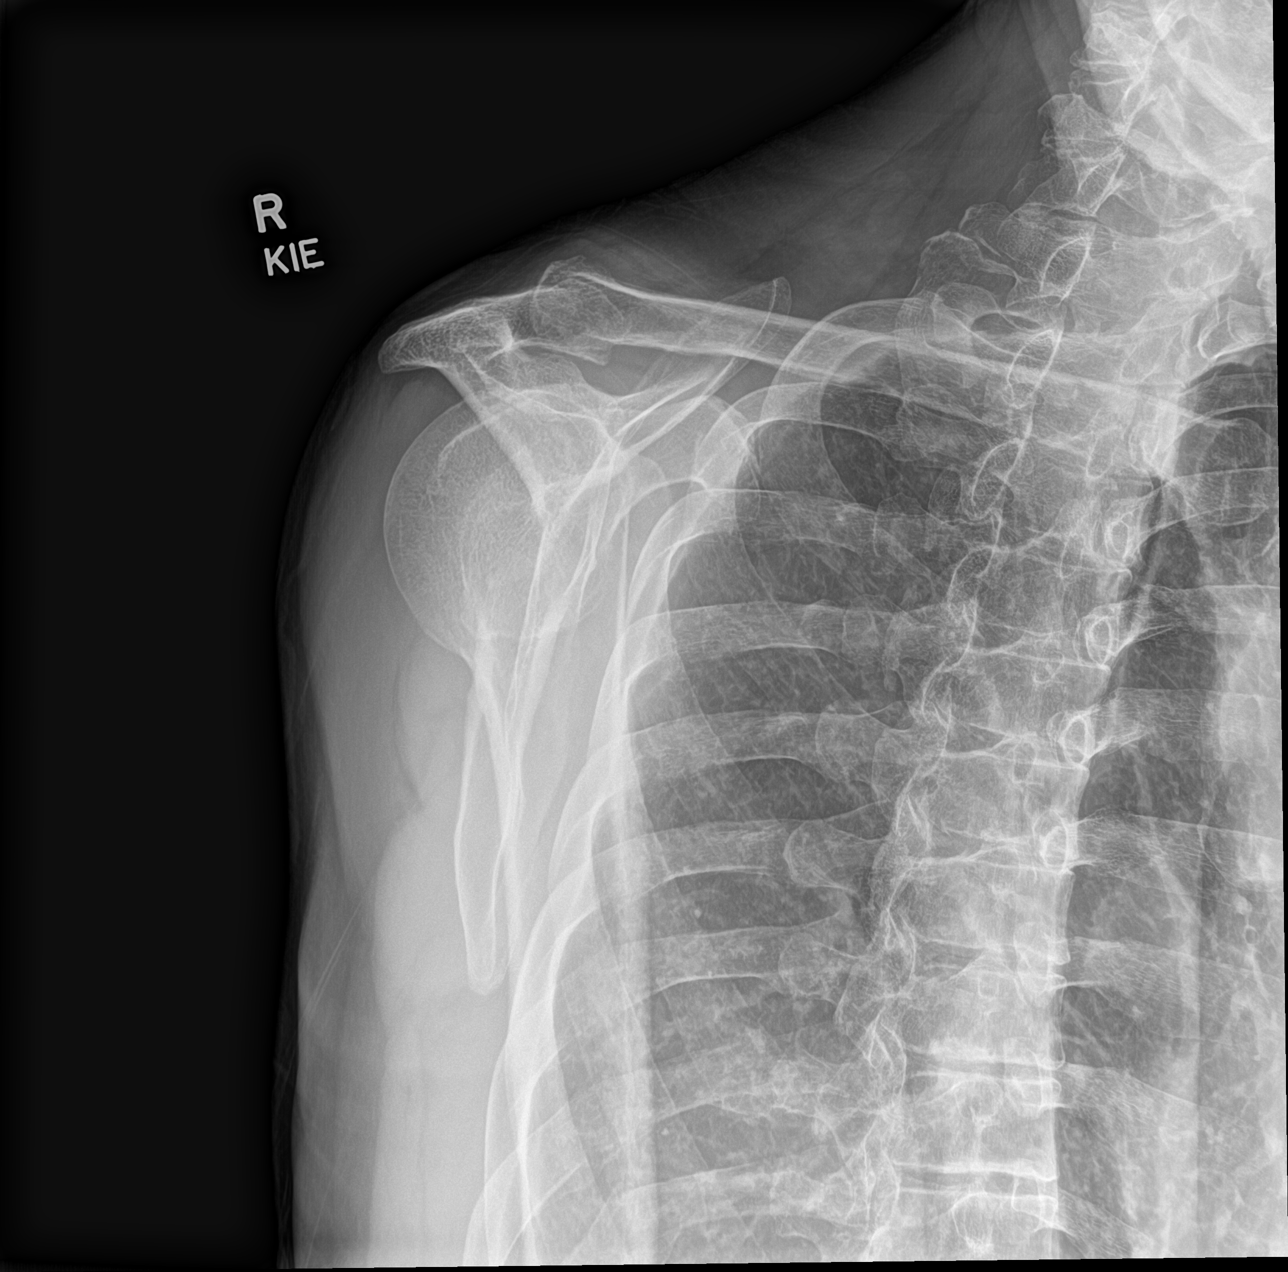

[shoulder grashey (2 of 2)]
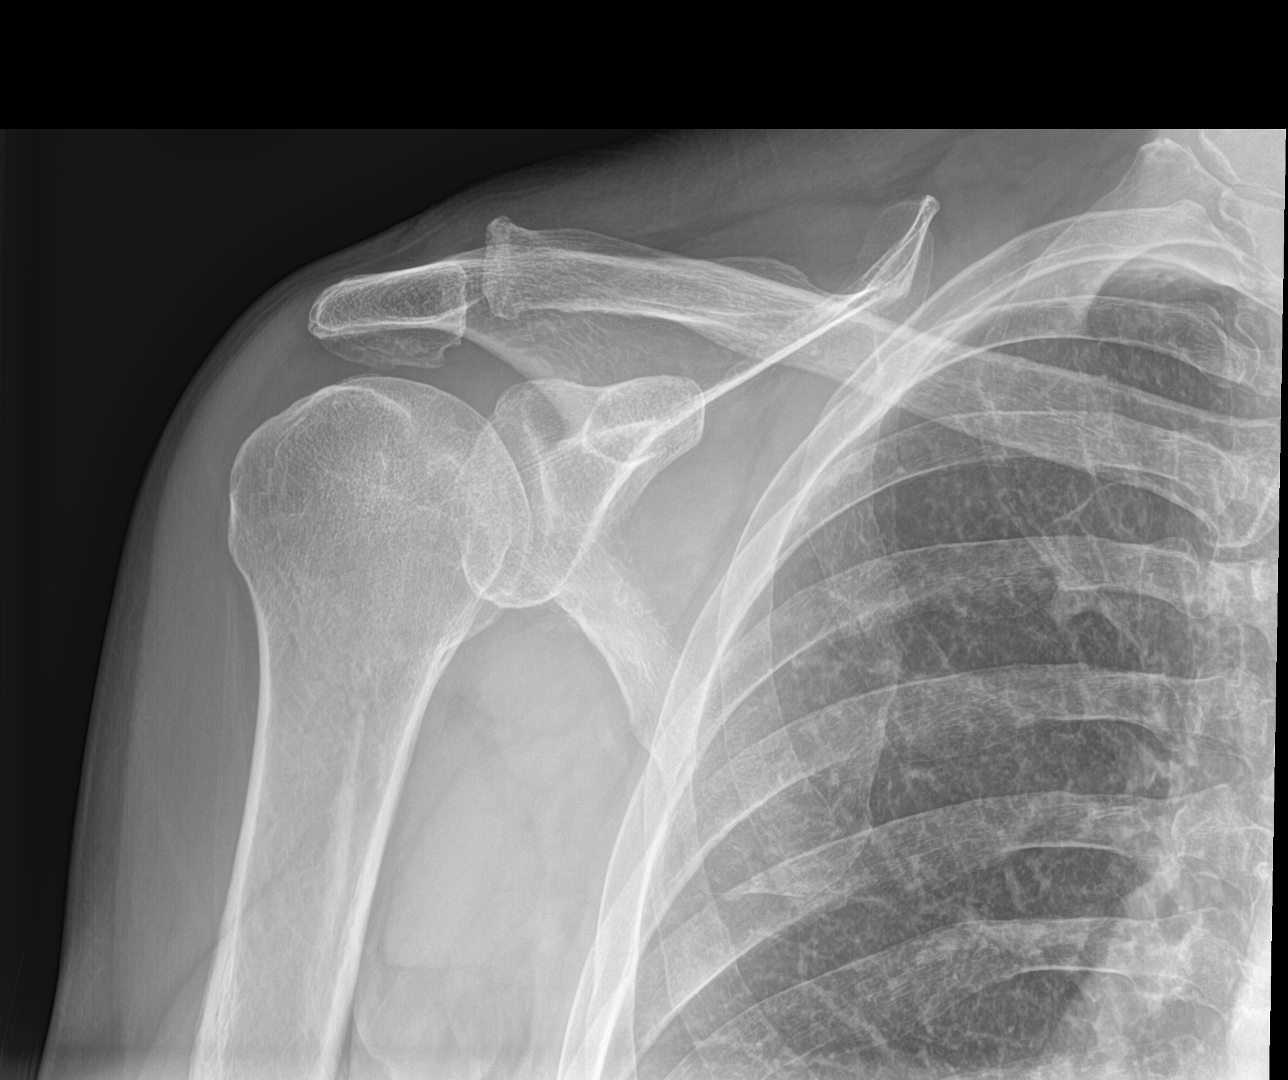

[3 of 3 positions shown; findings below may reference images not displayed]

FINDINGS: The distal right clavicle is deviated 8 mm superiorly relative to
the acromion. No fractures. No other abnormalities identified.
IMPRESSION: Mild deviation of the distal clavicle relative to the acromion as
above. An AC joint injury is possible. Recommend clinical
correlation. No fractures.

## 2023-01-02 ENCOUNTER — Emergency Department (HOSPITAL_BASED_OUTPATIENT_CLINIC_OR_DEPARTMENT_OTHER)
Admission: EM | Admit: 2023-01-02 | Discharge: 2023-01-02 | Disposition: A | Discharge status: Home / Self Care | Payer: Medicare HMO | Attending: Emergency Medicine | Admitting: Emergency Medicine

## 2023-01-02 ENCOUNTER — Other Ambulatory Visit: Payer: Self-pay

## 2023-01-02 ENCOUNTER — Encounter (HOSPITAL_BASED_OUTPATIENT_CLINIC_OR_DEPARTMENT_OTHER): Payer: Self-pay | Admitting: Emergency Medicine

## 2023-01-02 DIAGNOSIS — N182 Chronic kidney disease, stage 2 (mild): Secondary | ICD-10-CM | POA: Diagnosis present

## 2023-01-02 LAB — CBC WITH DIFFERENTIAL/PLATELET
Abs Immature Granulocytes: 0.02 10*3/uL (ref 0.00–0.07)
Basophils Absolute: 0 10*3/uL (ref 0.0–0.1)
Basophils Relative: 0 %
Eosinophils Absolute: 0.1 10*3/uL (ref 0.0–0.5)
Eosinophils Relative: 2 %
HCT: 41.2 % (ref 39.0–52.0)
Hemoglobin: 14.3 g/dL (ref 13.0–17.0)
Immature Granulocytes: 0 %
Lymphocytes Relative: 27 %
Lymphs Abs: 2.3 10*3/uL (ref 0.7–4.0)
MCH: 30 pg (ref 26.0–34.0)
MCHC: 34.7 g/dL (ref 30.0–36.0)
MCV: 86.4 fL (ref 80.0–100.0)
Monocytes Absolute: 0.6 10*3/uL (ref 0.1–1.0)
Monocytes Relative: 7 %
Neutro Abs: 5.6 10*3/uL (ref 1.7–7.7)
Neutrophils Relative %: 64 %
Platelets: 214 10*3/uL (ref 150–400)
RBC: 4.77 MIL/uL (ref 4.22–5.81)
RDW: 14.5 % (ref 11.5–15.5)
WBC: 8.7 10*3/uL (ref 4.0–10.5)
nRBC: 0 % (ref 0.0–0.2)

## 2023-01-02 LAB — COMPREHENSIVE METABOLIC PANEL
ALT: 49 U/L — ABNORMAL HIGH (ref 0–44)
AST: 126 U/L — ABNORMAL HIGH (ref 15–41)
Albumin: 4.8 g/dL (ref 3.5–5.0)
Alkaline Phosphatase: 70 U/L (ref 38–126)
Anion gap: 13 (ref 5–15)
BUN: 21 mg/dL (ref 8–23)
CO2: 21 mmol/L — ABNORMAL LOW (ref 22–32)
Calcium: 9.9 mg/dL (ref 8.9–10.3)
Chloride: 105 mmol/L (ref 98–111)
Creatinine, Ser: 1.26 mg/dL — ABNORMAL HIGH (ref 0.61–1.24)
GFR, Estimated: >60 mL/min (ref >60)
Glucose, Bld: 113 mg/dL — ABNORMAL HIGH (ref 70–99)
Potassium: 3.9 mmol/L (ref 3.5–5.1)
Sodium: 139 mmol/L (ref 135–145)
Total Bilirubin: 1.4 mg/dL — ABNORMAL HIGH (ref 0.3–1.2)
Total Protein: 8.4 g/dL — ABNORMAL HIGH (ref 6.5–8.1)

## 2023-01-02 LAB — URINALYSIS, ROUTINE W REFLEX MICROSCOPIC
Bacteria, UA: NONE SEEN
Bilirubin Urine: NEGATIVE
Glucose, UA: NEGATIVE mg/dL
Ketones, ur: NEGATIVE mg/dL
Leukocytes,Ua: NEGATIVE
Nitrite: NEGATIVE
Protein, ur: NEGATIVE mg/dL
Specific Gravity, Urine: 1.009 (ref 1.005–1.030)
pH: 5.5 (ref 5.0–8.0)

## 2023-01-02 NOTE — ED Triage Notes (Signed)
Pt goes to pain clinic for his back (verterbrae). His dr stated his blood work looked like he had bad kidneys. That was 10 days ago. Pt is on hyrdrocodone. Pt states his memory is bad last few days and voiding a lot.

## 2023-01-02 NOTE — Discharge Instructions (Signed)
Please help with your family doctor in the office.  Please return for worsening pain fever inability eat or drink.

## 2023-01-02 NOTE — ED Notes (Signed)
Pt discharged in stable condition. Pt and family member expressed understanding about discharge instructions and to follow up with pcp and to return to ER for any further concerns or complications. Pt ambulated out with even steady gait, no apparent distress.

## 2023-01-02 NOTE — ED Provider Notes (Signed)
[place] EMERGENCY DEPARTMENT AT [facility] Provider Note   CSN: [id number] Arrival date & time: 01/02/23  1038     History  No chief complaint on file.   Howard Becker is a 72 y.o. male.  72 yo M with a chief complaint of concern for his kidney function.  He saw his pain management doctor recently and they told him his kidney function was worse and so he came here to be evaluated.  He says that nothing really has changed recently but after some probing he tells me that he maybe had some blood in his stool at some point today and has had some abdominal pain off and on.  He is mostly worried about his kidneys though.  He is not sure with damaging them and is wondering what he can do to fix it.  No fevers has been urinating more frequently but feels like he can completely empty his bladder.  He has chronic back pain but does not feel like that is changed.  Also feels like he needs to breathe deeply from time to time has also been going on for some time and does not feel like it is changed.        Home Medications Prior to Admission medications   Medication Sig Start Date End Date Taking? Authorizing Provider  Ascorbic Acid (VITAMIN C) 1000 MG tablet Take 2,000 mg by mouth daily.    [provider]  clotrimazole-betamethasone (LOTRISONE) cream Apply 1 Application topically 2 (two) times daily. 12/29/21   McDonald, Rachelle Hora, DPM  diclofenac (VOLTAREN) 75 MG EC tablet Take 1 tablet (75 mg total) by mouth 2 (two) times daily as needed (pain). 12/29/21   McDonald, Rachelle Hora, DPM  mirtazapine (REMERON) 30 MG tablet Take 30 mg by mouth at bedtime. 12/12/21   [provider]  Multiple Vitamin (MULITIVITAMIN WITH MINERALS) TABS Take 2 tablets by mouth every evening.     [provider]  pregabalin (LYRICA) 150 MG capsule Take 150 mg by mouth 2 (two) times daily. 12/09/21   [provider]  QUEtiapine (SEROQUEL) 25 MG tablet Take 25 mg by mouth 2 (two)  times daily. 12/09/21   [provider]  thiamine 100 MG tablet Take 1 tablet (100 mg total) by mouth daily. 04/26/17   Rolly Salter, MD      Allergies    Patient has no known allergies.    Review of Systems   Review of Systems  Physical Exam Updated Vital Signs BP (!) 145/86 (BP Location: Right Arm)   Pulse 86   Temp 97.6 F (36.4 C)   Resp 18   Wt 92.5 kg   SpO2 97%   BMI 26.55 kg/m  Physical Exam Vitals and nursing note reviewed.  Constitutional:      Appearance: He is well-developed.  HENT:     Head: Normocephalic and atraumatic.  Eyes:     Pupils: Pupils are equal, round, and reactive to light.  Neck:     Vascular: No JVD.  Cardiovascular:     Rate and Rhythm: Normal rate and regular rhythm.     Heart sounds: No murmur heard.    No friction rub. No gallop.  Pulmonary:     Effort: No respiratory distress.     Breath sounds: No wheezing.  Abdominal:     General: There is no distension.     Tenderness: There is no abdominal tenderness. There is no guarding or rebound.  Musculoskeletal:  General: Normal range of motion.     Cervical back: Normal range of motion and neck supple.  Skin:    Coloration: Skin is not pale.     Findings: No rash.  Neurological:     Mental Status: He is alert and oriented to person, place, and time.  Psychiatric:        Behavior: Behavior normal.     ED Results / Procedures / Treatments   Labs (all labs ordered are listed, but only abnormal results are displayed) Labs Reviewed  URINALYSIS, ROUTINE W REFLEX MICROSCOPIC - Abnormal; Notable for the following components:      Result Value   Hgb urine dipstick SMALL (*)    All other components within normal limits  COMPREHENSIVE METABOLIC PANEL - Abnormal; Notable for the following components:   CO2 21 (*)    Glucose, Bld 113 (*)    Creatinine, Ser 1.26 (*)    Total Protein 8.4 (*)    AST 126 (*)    ALT 49 (*)    Total Bilirubin 1.4 (*)    All other components  within normal limits  CBC WITH DIFFERENTIAL/PLATELET    EKG None  Radiology No results found.  Procedures Procedures    Medications Ordered in ED Medications - No data to display  ED Course/ Medical Decision Making/ A&P                                 Medical Decision Making Amount and/or Complexity of Data Reviewed Labs: ordered.   72 yo M with a chief complaint of concern for his renal function.  He tells me he saw his pain management clinic and they checked his renal function and told him it was worse.  Has not really sure what that means and is working him over the weekend and so came in to be evaluated.  He also has multiple other symptoms that it sounds like he has had for a long time and have not changed, he had had blood in the stool in the past that is resolved.  Has had some abdominal pain that he does not have currently.  He feels like he has to breathe deeply at times and usually goes away on its own.  He has back pain notes been ongoing which is why he sees the pain clinic and that has not changed significantly.   After discussion at bedside.  Will obtain blood work to assess his renal function as I cannot visualize his labs done at the pain clinic.  He provided to the UA in triage that on my independent interpretation is negative for infection.  No anemia, no leukocytosis.  His LFTs are very mildly elevated.  Could be due to dehydration.  Patient's creatinine is 1.26.  This is a little bit worse than it had been last check in our system which was a couple years ago that better than when he came to the hospital for an acute kidney injury 5 years ago.  I am not sure what the result was in his pain clinic.  I discussed results with the patient.  He still seems pretty worried about his renal function.  I encouraged him to contact his primary care physician.  2:31 PM:  I have discussed the diagnosis/risks/treatment options with the patient.  Evaluation and diagnostic  testing in the emergency department does not suggest an emergent condition requiring admission or immediate intervention beyond  what has been performed at this time.  They will follow up with PCP. We also discussed returning to the ED immediately if new or worsening sx occur. We discussed the sx which are most concerning (e.g., sudden worsening pain, fever, inability to tolerate by mouth) that necessitate immediate return. Medications administered to the patient during their visit and any new prescriptions provided to the patient are listed below.  Medications given during this visit Medications - No data to display   The patient appears reasonably screen and/or stabilized for discharge and I doubt any other medical condition or other [facility] requiring further screening, evaluation, or treatment in the ED at this time prior to discharge.          Final Clinical Impression(s) / ED Diagnoses Final diagnoses:  CKD (chronic kidney disease) stage 2, GFR 60-89 ml/min    Rx / DC Orders ED Discharge Orders     None         Melene Plan, DO 01/02/23 1432

## 2023-01-04 ENCOUNTER — Other Ambulatory Visit: Payer: Self-pay | Admitting: Internal Medicine

## 2023-01-04 DIAGNOSIS — N182 Chronic kidney disease, stage 2 (mild): Secondary | ICD-10-CM

## 2023-01-05 ENCOUNTER — Ambulatory Visit
Admission: RE | Admit: 2023-01-05 | Discharge: 2023-01-05 | Disposition: A | Discharge status: Home / Self Care | Payer: Medicare HMO | Source: Ambulatory Visit | Attending: Internal Medicine | Admitting: Internal Medicine

## 2023-01-05 DIAGNOSIS — N182 Chronic kidney disease, stage 2 (mild): Secondary | ICD-10-CM

## 2023-05-23 ENCOUNTER — Encounter: Payer: Self-pay | Admitting: Cardiology

## 2023-07-30 NOTE — Progress Notes (Signed)
 Referring-Erica Cox, NP Reason for referral-abnormal electrocardiogram  HPI: 73 year old male for evaluation of abnormal electrocardiogram at request of Osie Bleacher, NP.  Echocardiogram February 2019 showed ejection fraction 40 to 45% and there was note of dyssynchrony, grade 1 diastolic dysfunction, trace aortic insufficiency, mildly dilated aortic root, moderate right ventricular enlargement, moderate RV dysfunction.  Patient states that his primary care told him that he was in atrial fibrillation in March of this year.  I do not have electrocardiogram to review.  We were therefore asked to further evaluate.  He denies any dyspnea, chest pain, palpitations, syncope or pedal edema.  Current Outpatient Medications  Medication Sig Dispense Refill   Ascorbic Acid (VITAMIN C) 1000 MG tablet Take 2,000 mg by mouth daily.     clotrimazole -betamethasone  (LOTRISONE ) cream Apply 1 Application topically 2 (two) times daily. 30 g 0   diclofenac  (VOLTAREN ) 75 MG EC tablet Take 1 tablet (75 mg total) by mouth 2 (two) times daily as needed (pain). 60 tablet 0   IRON, FERROUS SULFATE, PO Take 1 tablet by mouth daily.     ketoconazole (NIZORAL) 2 % cream Apply 1 Application topically daily.     Multiple Vitamin (MULITIVITAMIN WITH MINERALS) TABS Take 2 tablets by mouth every evening.      pregabalin  (LYRICA ) 150 MG capsule Take 150 mg by mouth 2 (two) times daily.     QUEtiapine (SEROQUEL) 25 MG tablet Take 25 mg by mouth 2 (two) times daily.     thiamine  100 MG tablet Take 1 tablet (100 mg total) by mouth daily. 30 tablet 0   traMADol  (ULTRAM ) 50 MG tablet Take 50 mg by mouth 4 (four) times daily as needed.     No current facility-administered medications for this visit.    Allergies  Allergen Reactions   Venlafaxine      Past Medical History:  Diagnosis Date   Abnormal serum level of lipase    Acute kidney injury (HCC)    Chest pain    Depression    ETOH abuse    Hyperlipidemia     Hypertension    Hyponatremia     Past Surgical History:  Procedure Laterality Date   BACK SURGERY     HERNIA REPAIR     LITHOTRIPSY      Social History   Socioeconomic History   Marital status: Married    Spouse name: Not on file   Number of children: 1   Years of education: Not on file   Highest education level: Not on file  Occupational History   Not on file  Tobacco Use   Smoking status: Former   Smokeless tobacco: Never  Vaping Use   Vaping status: Never Used  Substance and Sexual Activity   Alcohol use: No    Comment: Occasional   Drug use: No   Sexual activity: Not on file  Other Topics Concern   Not on file  Social History Narrative   Not on file   Social Drivers of Health   Financial Resource Strain: Not on file  Food Insecurity: Not on file  Transportation Needs: Not on file  Physical Activity: Not on file  Stress: Not on file  Social Connections: Not on file  Intimate Partner Violence: Not on file    Family History  Problem Relation Age of Onset   Hypertension Mother    Cancer Father     ROS: no fevers or chills, productive cough, hemoptysis, dysphasia, odynophagia, melena, hematochezia, dysuria,  hematuria, rash, seizure activity, orthopnea, PND, pedal edema, claudication. Remaining systems are negative.  Physical Exam:   Blood pressure (!) 90/50, pulse 82, height 6\' 1"  (1.854 m), weight 210 lb (95.3 kg).  General:  Well developed/well nourished in NAD Skin warm/dry Patient not depressed No peripheral clubbing Back-normal HEENT-normal/normal eyelids Neck supple/normal carotid upstroke bilaterally; no bruits; no JVD; no thyromegaly chest - CTA/ normal expansion CV - RRR/normal S1 and S2; no murmurs, rubs or gallops;  PMI nondisplaced Abdomen -NT/ND, no HSM, no mass, + bowel sounds, positive bruit 2+ femoral pulses, no bruits Ext-no edema, chords, 2+ DP Neuro-grossly nonfocal  EKG Interpretation Date/Time:  Friday August 12 2023 08:09:02  EDT Ventricular Rate:  82 PR Interval:  146 QRS Duration:  94 QT Interval:  370 QTC Calculation: 432 R Axis:   34  Text Interpretation: Normal sinus rhythm Confirmed by Alexandria Angel ([id number]) on 08/12/2023 8:09:39 AM    A/P  1 question atrial fibrillation-patient was told he was in atrial fibrillation in March.  I do not have the electrocardiogram to verify this.  We will obtain this from primary care and make further recommendations based on the tracing.  2 abnormal ECG-plan as outlined above.  3 history of cardiomyopathy-plan repeat echocardiogram.  4 abdominal bruit-schedule ultrasound to rule out aneurysm.  Alexandria Angel, MD

## 2023-08-12 ENCOUNTER — Ambulatory Visit: Attending: Cardiology | Admitting: Cardiology

## 2023-08-12 ENCOUNTER — Telehealth: Payer: Self-pay | Admitting: *Deleted

## 2023-08-12 ENCOUNTER — Encounter: Payer: Self-pay | Admitting: Cardiology

## 2023-08-12 VITALS — BP 90/50 | HR 82 | Ht 73.0 in | Wt 210.0 lb

## 2023-08-12 DIAGNOSIS — R9431 Abnormal electrocardiogram [ECG] [EKG]: Secondary | ICD-10-CM

## 2023-08-12 DIAGNOSIS — R0989 Other specified symptoms and signs involving the circulatory and respiratory systems: Secondary | ICD-10-CM | POA: Diagnosis not present

## 2023-08-12 DIAGNOSIS — I420 Dilated cardiomyopathy: Secondary | ICD-10-CM

## 2023-08-12 DIAGNOSIS — R002 Palpitations: Secondary | ICD-10-CM

## 2023-08-12 NOTE — Patient Instructions (Signed)
   Testing/Procedures:  Your physician has requested that you have an echocardiogram. Echocardiography is a painless test that uses sound waves to create images of your heart. It provides your doctor with information about the size and shape of your heart and how well your heart's chambers and valves are working. This procedure takes approximately one hour. There are no restrictions for this procedure. Please do NOT wear cologne, perfume, aftershave, or lotions (deodorant is allowed). Please arrive 15 minutes prior to your appointment time.  Please note: We ask at that you not bring children with you during ultrasound (echo/ vascular) testing. Due to room size and safety concerns, children are not allowed in the ultrasound rooms during exams. Our front office staff cannot provide observation of children in our lobby area while testing is being conducted. An adult accompanying a patient to their appointment will only be allowed in the ultrasound room at the discretion of the ultrasound technician under special circumstances. We apologize for any inconvenience. [address]   Your physician has requested that you have an abdominal aorta duplex. During this test, an ultrasound is used to evaluate the aorta. Allow 30 minutes for this exam. Do not eat after midnight the day before and avoid carbonated beverages.  Please note: We ask at that you not bring children with you during ultrasound (echo/ vascular) testing. Due to room size and safety concerns, children are not allowed in the ultrasound rooms during exams. Our front office staff cannot provide observation of children in our lobby area while testing is being conducted. An adult accompanying a patient to their appointment will only be allowed in the ultrasound room at the discretion of the ultrasound technician under special circumstances. We apologize for any inconvenience. [address]  Follow-Up: At [facility], you and your health  needs are our priority.  As part of our continuing mission to provide you with exceptional heart care, our providers are all part of one team.  This team includes your primary Cardiologist (physician) and Advanced Practice Providers or APPs (Physician Assistants and Nurse Practitioners) who all work together to provide you with the care you need, when you need it.  Your next appointment:   AS NEEDED

## 2023-08-12 NOTE — Telephone Encounter (Signed)
 Left message for pt to call, he was seen as a new patient today for abnormal ECG. Called his medical doctor and they do not have an ECG. ? Where was the ECG done so I can get the results.

## 2023-08-15 NOTE — Telephone Encounter (Signed)
  Pt is returning call, he said, he got his ECG done at [facility] - [phone]

## 2023-08-15 NOTE — Telephone Encounter (Signed)
 ECG done at [facility] - [phone]   I called and spoke with Carmelina Chinchilla and requested the pts recent ECG to be faxed to Dr Lionell Riddles his review.

## 2023-08-16 NOTE — Telephone Encounter (Signed)
 Felipa Horsfall calling back from [facility] to advise that they have no EKG on file for pt. If you need to speak with her, she can be reached at [phone]

## 2023-08-16 NOTE — Telephone Encounter (Signed)
 Spoke to Ladd with [facility] she stated she is unable to find EKG done on 5/29.Advised I will make Dr.Crenshaw aware.

## 2023-08-17 NOTE — Telephone Encounter (Signed)
 Pt called back and I told him [facility] said they don't have an EKG. He said they did do an EKG and he was going to call them himself to see what is going on.

## 2023-08-17 NOTE — Telephone Encounter (Signed)
 Left message for pt to call , where did he have the ECG done.

## 2023-08-19 NOTE — Telephone Encounter (Signed)
 Spoke with pt, he is going over to the office today to talk with them. He will call me back.

## 2023-08-19 NOTE — Telephone Encounter (Signed)
 Spoke with pt, aware since they can not find his ECG, it maybe worth while to get a kardia mobile so in the future if he were to have palpitations or elevated heart rate, he would be able to do an ECG.

## 2023-08-19 NOTE — Telephone Encounter (Signed)
 He said that he went over to to the Primary Care office today and they are unable to locate the EKG that was performed on the patient.

## 2023-08-19 NOTE — Telephone Encounter (Signed)
 Pt calling back to provide the nurse with some information.

## 2023-08-22 ENCOUNTER — Telehealth (HOSPITAL_COMMUNITY): Payer: Self-pay | Admitting: Cardiology

## 2023-08-22 NOTE — Telephone Encounter (Signed)
 Patient called and cancelled echocardiogram for 09/2423 and does not want to reschedule. Order will be removed from the WQ. Order will be removed from the WQ. Thank you.

## 2023-09-01 ENCOUNTER — Encounter (HOSPITAL_COMMUNITY)

## 2023-09-28 ENCOUNTER — Other Ambulatory Visit (HOSPITAL_COMMUNITY)

## 2023-10-18 ENCOUNTER — Other Ambulatory Visit: Payer: Self-pay

## 2023-10-18 ENCOUNTER — Emergency Department (HOSPITAL_BASED_OUTPATIENT_CLINIC_OR_DEPARTMENT_OTHER)

## 2023-10-18 ENCOUNTER — Emergency Department (HOSPITAL_BASED_OUTPATIENT_CLINIC_OR_DEPARTMENT_OTHER): Admitting: Radiology

## 2023-10-18 ENCOUNTER — Observation Stay (HOSPITAL_BASED_OUTPATIENT_CLINIC_OR_DEPARTMENT_OTHER)
Admission: EM | Admit: 2023-10-18 | Discharge: 2023-10-19 | Disposition: A | Discharge status: Home / Self Care | Attending: Family Medicine | Admitting: Family Medicine

## 2023-10-18 ENCOUNTER — Encounter (HOSPITAL_BASED_OUTPATIENT_CLINIC_OR_DEPARTMENT_OTHER): Payer: Self-pay

## 2023-10-18 DIAGNOSIS — G609 Hereditary and idiopathic neuropathy, unspecified: Secondary | ICD-10-CM | POA: Diagnosis not present

## 2023-10-18 DIAGNOSIS — S2241XA Multiple fractures of ribs, right side, initial encounter for closed fracture: Secondary | ICD-10-CM | POA: Diagnosis not present

## 2023-10-18 DIAGNOSIS — M25511 Pain in right shoulder: Secondary | ICD-10-CM | POA: Diagnosis present

## 2023-10-18 DIAGNOSIS — W19XXXA Unspecified fall, initial encounter: Secondary | ICD-10-CM | POA: Insufficient documentation

## 2023-10-18 DIAGNOSIS — I10 Essential (primary) hypertension: Secondary | ICD-10-CM | POA: Insufficient documentation

## 2023-10-18 DIAGNOSIS — R9082 White matter disease, unspecified: Secondary | ICD-10-CM | POA: Diagnosis not present

## 2023-10-18 DIAGNOSIS — Z87891 Personal history of nicotine dependence: Secondary | ICD-10-CM | POA: Diagnosis not present

## 2023-10-18 DIAGNOSIS — S0990XA Unspecified injury of head, initial encounter: Secondary | ICD-10-CM | POA: Insufficient documentation

## 2023-10-18 DIAGNOSIS — S42034A Nondisplaced fracture of lateral end of right clavicle, initial encounter for closed fracture: Secondary | ICD-10-CM | POA: Diagnosis not present

## 2023-10-18 DIAGNOSIS — Z8659 Personal history of other mental and behavioral disorders: Secondary | ICD-10-CM | POA: Insufficient documentation

## 2023-10-18 DIAGNOSIS — I429 Cardiomyopathy, unspecified: Secondary | ICD-10-CM | POA: Insufficient documentation

## 2023-10-18 DIAGNOSIS — F39 Unspecified mood [affective] disorder: Secondary | ICD-10-CM | POA: Diagnosis not present

## 2023-10-18 DIAGNOSIS — S42031A Displaced fracture of lateral end of right clavicle, initial encounter for closed fracture: Principal | ICD-10-CM | POA: Insufficient documentation

## 2023-10-18 DIAGNOSIS — F419 Anxiety disorder, unspecified: Secondary | ICD-10-CM | POA: Diagnosis present

## 2023-10-18 DIAGNOSIS — S42001A Fracture of unspecified part of right clavicle, initial encounter for closed fracture: Secondary | ICD-10-CM | POA: Diagnosis present

## 2023-10-18 DIAGNOSIS — S2249XA Multiple fractures of ribs, unspecified side, initial encounter for closed fracture: Secondary | ICD-10-CM | POA: Diagnosis present

## 2023-10-18 LAB — COMPREHENSIVE METABOLIC PANEL WITH GFR
ALT: 28 U/L (ref 0–44)
AST: 46 U/L — ABNORMAL HIGH (ref 15–41)
Albumin: 4.2 g/dL (ref 3.5–5.0)
Alkaline Phosphatase: 88 U/L (ref 38–126)
Anion gap: 12 (ref 5–15)
BUN: 19 mg/dL (ref 8–23)
CO2: 23 mmol/L (ref 22–32)
Calcium: 9.5 mg/dL (ref 8.9–10.3)
Chloride: 102 mmol/L (ref 98–111)
Creatinine, Ser: 1.19 mg/dL (ref 0.61–1.24)
GFR, Estimated: >60 mL/min (ref >60)
Glucose, Bld: 117 mg/dL — ABNORMAL HIGH (ref 70–99)
Potassium: 4.2 mmol/L (ref 3.5–5.1)
Sodium: 137 mmol/L (ref 135–145)
Total Bilirubin: 0.9 mg/dL (ref 0.0–1.2)
Total Protein: 7.8 g/dL (ref 6.5–8.1)

## 2023-10-18 LAB — CBC WITH DIFFERENTIAL/PLATELET
Abs Immature Granulocytes: 0.04 K/uL (ref 0.00–0.07)
Basophils Absolute: 0 K/uL (ref 0.0–0.1)
Basophils Relative: 0 %
Eosinophils Absolute: 0.1 K/uL (ref 0.0–0.5)
Eosinophils Relative: 1 %
HCT: 40.6 % (ref 39.0–52.0)
Hemoglobin: 13.9 g/dL (ref 13.0–17.0)
Immature Granulocytes: 0 %
Lymphocytes Relative: 12 %
Lymphs Abs: 1.4 K/uL (ref 0.7–4.0)
MCH: 30.2 pg (ref 26.0–34.0)
MCHC: 34.2 g/dL (ref 30.0–36.0)
MCV: 88.1 fL (ref 80.0–100.0)
Monocytes Absolute: 0.9 K/uL (ref 0.1–1.0)
Monocytes Relative: 7 %
Neutro Abs: 9.4 K/uL — ABNORMAL HIGH (ref 1.7–7.7)
Neutrophils Relative %: 80 %
Platelets: 229 K/uL (ref 150–400)
RBC: 4.61 MIL/uL (ref 4.22–5.81)
RDW: 15 % (ref 11.5–15.5)
WBC: 11.9 K/uL — ABNORMAL HIGH (ref 4.0–10.5)
nRBC: 0 % (ref 0.0–0.2)

## 2023-10-18 LAB — PROTIME-INR
INR: 1 (ref 0.8–1.2)
Prothrombin Time: 13.4 s (ref 11.4–15.2)

## 2023-10-18 MED ORDER — MORPHINE SULFATE (PF) 4 MG/ML IV SOLN
4.0000 mg | Freq: Once | INTRAVENOUS | Status: AC
  Administered 2023-10-18 (×2): 4 mg via INTRAVENOUS
  Filled 2023-10-18: qty 1

## 2023-10-18 MED ORDER — ACETAMINOPHEN 325 MG PO TABS: 650.0000 mg | ORAL_TABLET | Freq: Four times a day (QID) | ORAL | Status: DC | PRN

## 2023-10-18 MED ORDER — ACETAMINOPHEN 650 MG RE SUPP: 650.0000 mg | Freq: Four times a day (QID) | RECTAL | Status: DC | PRN

## 2023-10-18 MED ORDER — BISACODYL 5 MG PO TBEC: 5.0000 mg | DELAYED_RELEASE_TABLET | Freq: Every day | ORAL | Status: DC | PRN

## 2023-10-18 MED ORDER — PROCHLORPERAZINE EDISYLATE 10 MG/2ML IJ SOLN: 5.0000 mg | Freq: Four times a day (QID) | INTRAMUSCULAR | Status: DC | PRN

## 2023-10-18 MED ORDER — PREGABALIN 100 MG PO CAPS
300.0000 mg | ORAL_CAPSULE | Freq: Three times a day (TID) | ORAL | Status: DC
  Administered 2023-10-18 – 2023-10-19 (×4): 300 mg via ORAL
  Filled 2023-10-18 (×2): qty 3

## 2023-10-18 MED ORDER — HYDROCODONE-ACETAMINOPHEN 5-325 MG PO TABS
1.0000 | ORAL_TABLET | Freq: Once | ORAL | Status: AC
  Administered 2023-10-18 (×2): 1 via ORAL
  Filled 2023-10-18: qty 1

## 2023-10-18 MED ORDER — ENOXAPARIN SODIUM 40 MG/0.4ML IJ SOSY
40.0000 mg | PREFILLED_SYRINGE | INTRAMUSCULAR | Status: DC
  Administered 2023-10-18 (×2): 40 mg via SUBCUTANEOUS
  Filled 2023-10-18: qty 0.4

## 2023-10-18 MED ORDER — POLYETHYLENE GLYCOL 3350 17 G PO PACK
17.0000 g | PACK | Freq: Every day | ORAL | Status: DC | PRN
Start: 2023-10-18 — End: 2023-10-19

## 2023-10-18 MED ORDER — HYDROMORPHONE HCL 1 MG/ML IJ SOLN: 0.5000 mg | INTRAMUSCULAR | Status: DC | PRN

## 2023-10-18 MED ORDER — HYDROCODONE-ACETAMINOPHEN 5-325 MG PO TABS
1.0000 | ORAL_TABLET | ORAL | Status: DC | PRN
  Administered 2023-10-19 (×6): 2 via ORAL
  Filled 2023-10-18 (×3): qty 2

## 2023-10-18 MED ORDER — QUETIAPINE FUMARATE 25 MG PO TABS
25.0000 mg | ORAL_TABLET | Freq: Two times a day (BID) | ORAL | Status: DC
Start: 2023-10-18 — End: 2023-10-19
  Administered 2023-10-18 – 2023-10-19 (×4): 25 mg via ORAL
  Filled 2023-10-18 (×2): qty 1

## 2023-10-18 MED ORDER — HYDROCODONE-ACETAMINOPHEN 5-325 MG PO TABS
1.0000 | ORAL_TABLET | ORAL | Status: DC | PRN
  Administered 2023-10-18 (×2): 1 via ORAL
  Filled 2023-10-18: qty 1

## 2023-10-18 NOTE — ED Provider Notes (Signed)
 [place] [facility]ERGENCY DEPARTMENT AT [facility] Provider Note   CSN: [id number] Arrival date & time: 10/18/23  1040     Patient presents with: Fall, Shoulder Pain, and Back Pain   Howard Becker is a 73 y.o. male with a past medical history significant for hypertension, depression, and alcohol abuse who presents to the ED after a mechanical fall that occurred yesterday around 4 PM.  Patient notes he was stepping over a lattice at his house when he lost his balance and fell backwards.  He also notes he landed directly on his right shoulder.  Admits to hitting his head.  No LOC.  Not on any blood thinners.  Patient admits to right anterior chest wall pain and right upper back pain.  Also admits to some ecchymosis around right clavicle.  Denies numbness/tingling.  No weakness.  No other injuries.  Denies chest pain and shortness of breath.  History obtained from patient and past medical records. No interpreter used during encounter.       Prior to Admission medications   Medication Sig Start Date End Date Taking? Authorizing Provider  Ascorbic Acid (VITAMIN C) 1000 MG tablet Take 2,000 mg by mouth daily.    [provider]  clotrimazole -betamethasone  (LOTRISONE ) cream Apply 1 Application topically 2 (two) times daily. 12/29/21   McDonald, Juliene SAUNDERS, DPM  diclofenac  (VOLTAREN ) 75 MG EC tablet Take 1 tablet (75 mg total) by mouth 2 (two) times daily as needed (pain). 12/29/21   McDonald, Juliene SAUNDERS, DPM  HYDROcodone -acetaminophen  (NORCO) 10-325 MG tablet Take 1 tablet by mouth 2 (two) times daily as needed.    [provider]  IRON, FERROUS SULFATE, PO Take 1 tablet by mouth daily.    [provider]  ketoconazole (NIZORAL) 2 % cream Apply 1 Application topically daily. 07/28/23   [provider]  Multiple Vitamin (MULITIVITAMIN WITH MINERALS) TABS Take 2 tablets by mouth every evening.     [provider]  pregabalin  (LYRICA ) 150 MG capsule Take 150  mg by mouth 2 (two) times daily. 12/09/21   [provider]  pregabalin  (LYRICA ) 300 MG capsule Take 300 mg by mouth 3 (three) times daily.    [provider]  QUEtiapine  (SEROQUEL ) 25 MG tablet Take 25 mg by mouth 2 (two) times daily. 12/09/21   [provider]  thiamine  100 MG tablet Take 1 tablet (100 mg total) by mouth daily. 04/26/17   Tobie Yetta CHRISTELLA, MD  traMADol  (ULTRAM ) 50 MG tablet Take 50 mg by mouth 4 (four) times daily as needed. 07/28/23   [provider]    Allergies: Venlafaxine    Review of Systems  Respiratory:  Negative for shortness of breath.   Cardiovascular:  Positive for chest pain (chest wall pain).  Musculoskeletal:  Positive for arthralgias and back pain.    Updated Vital Signs BP 137/80   Pulse 94   Temp 97.7 F (36.5 C)   Resp 18   SpO2 97%   Physical Exam Vitals and nursing note reviewed.  Constitutional:      General: He is not in acute distress.    Appearance: He is not ill-appearing.  HENT:     Head: Normocephalic.  Eyes:     Pupils: Pupils are equal, round, and reactive to light.  Neck:     Comments: No cervical midline tenderness Cardiovascular:     Rate and Rhythm: Normal rate and regular rhythm.     Pulses: Normal pulses.  Heart sounds: Normal heart sounds. No murmur heard.    No friction rub. No gallop.  Pulmonary:     Effort: Pulmonary effort is normal.     Breath sounds: Normal breath sounds.  Abdominal:     General: Abdomen is flat. There is no distension.     Palpations: Abdomen is soft.     Tenderness: There is no abdominal tenderness. There is no guarding or rebound.  Musculoskeletal:        General: Normal range of motion.     Cervical back: Neck supple.     Comments: Ecchymosis to right anterior chest wall with tenderness over clavicle and right upper ribs. No thoracic or lumbar midline tenderness. Radial pulse intact. Soft compartments.   Skin:    General: Skin is warm and dry.   Neurological:     General: No focal deficit present.     Mental Status: He is alert.  Psychiatric:        Mood and Affect: Mood normal.        Behavior: Behavior normal.     (all labs ordered are listed, but only abnormal results are displayed) Labs Reviewed  CBC WITH DIFFERENTIAL/PLATELET - Abnormal; Notable for the following components:      Result Value   WBC 11.9 (*)    Neutro Abs 9.4 (*)    All other components within normal limits  COMPREHENSIVE METABOLIC PANEL WITH GFR - Abnormal; Notable for the following components:   Glucose, Bld 117 (*)    AST 46 (*)    All other components within normal limits  PROTIME-INR    EKG: None  Radiology: DG Ribs Unilateral W/Chest Right Result Date: 10/18/2023 EXAM: 3 VIEW(S) XRAY OF THE RIGHT RIBS AND CHEST 10/18/2023 11:41:00 AM COMPARISON: None available. CLINICAL HISTORY: Injury. Per triage notes: Patient fell yesterday at 1600 and now has right shoulder pain, back pain, and collar bone pain. -LOC, and not anticoagulated. Ecchymosis noted to right clavicle and shoulder. FINDINGS: BONES: There are slightly displaced fractures of the right lateral third and fourth ribs. The ribs otherwise appear intact. There is an obliquely oriented fracture of the lateral third of the right clavicle. LUNGS AND PLEURA: No consolidation or pulmonary edema. No pleural effusion or pneumothorax. HEART AND MEDIASTINUM: No acute abnormality of the cardiac and mediastinal silhouettes. IMPRESSION: 1. Slightly displaced fractures of the right lateral third and fourth ribs. 2. Obliquely oriented fracture of the lateral third of the right clavicle. 3. No pleural effusion or pneumothorax. Electronically signed by: evalene coho 10/18/2023 12:07 PM EDT RP Workstation: [id number]   DG Clavicle Right Result Date: 10/18/2023 EXAM: 3 VIEW(S) XRAY OF THE RIGHT CLAVICLE 10/18/2023 11:41:00 AM COMPARISON: None available. CLINICAL HISTORY: Patient fell yesterday at 1600 and  now has right shoulder pain, back pain, and collar bone pain. -LOC, and not anticoagulated. Ecchymosis noted to right clavicle and shoulder. FINDINGS: BONES: There is an obliquely oriented fracture of the lateral third of the right clavicle. There are slightly displaced fractures of the right lateral third and fourth ribs also present. JOINTS: There is normal alignment of the right acromioclavicular joint. The right glenohumeral joint is unremarkable. SOFT TISSUES: The soft tissues are unremarkable. PLEURAL SPACES: There is no evidence of pneumothorax. IMPRESSION: 1. Obliquely oriented fracture of the lateral third of the right clavicle. 2. Slightly displaced fractures of the right lateral third and fourth ribs. 3. No pneumothorax. Electronically signed by: evalene coho 10/18/2023 12:05 PM EDT RP Workstation: [id number]   DG  Shoulder Right Addendum Date: 10/18/2023 ADDENDUM #1 ADDENDUM: There is an obliquely oriented fracture of the lateral third of the right clavicle. ---------------------------------------------------- Electronically signed by: timothy berrigan 10/18/2023 11:58 AM EDT RP Workstation: [id number]   Result Date: 10/18/2023  ORIGINAL REPORT EXAM: 1 VIEW XRAY OF THE RIGHT SHOULDER 10/18/2023 11:41:00 AM COMPARISON: None available. CLINICAL HISTORY: Injury. Per triage notes: Patient fell yesterday at 1600 and now has right shoulder pain, back pain, and collar bone pain. -LOC, and not anticoagulated. Ecchymosis noted to right clavicle and shoulder. FINDINGS: BONES AND JOINTS: Glenohumeral joint is normally aligned. No acute fracture or dislocation. The [facility] joint is unremarkable in appearance. SOFT TISSUES: No abnormal calcifications. Visualized lung is unremarkable. IMPRESSION: 1. No significant abnormality. Electronically signed by: evalene coho 10/18/2023 11:55 AM EDT RP Workstation: [id number]   CT Cervical Spine Wo Contrast Result Date: 10/18/2023 EXAM: CT CERVICAL SPINE WITHOUT  CONTRAST 10/18/2023 11:44:00 AM TECHNIQUE: CT of the cervical spine was performed without the administration of intravenous contrast. Multiplanar reformatted images are provided for review. Automated exposure control, iterative reconstruction, and/or weight based adjustment of the mA/kV was utilized to reduce the radiation dose to as low as reasonably achievable. COMPARISON: None available. CLINICAL HISTORY: Neck trauma (Age >= 65y). Triage note: Patient fell yesterday at 1600 and now has right shoulder pain, back pain, and collar bone pain. -LOC, and not anticoagulated. Ecchymosis noted to right clavicle and shoulder. FINDINGS: CERVICAL SPINE: BONES AND ALIGNMENT: No acute fracture or traumatic malalignment. DEGENERATIVE CHANGES: Degenerative anterolisthesis present at C4-5, by approximately 3 mm. Bulging disc osteophyte complex present at C3-4, causing mild-to-moderate central spinal canal stenosis. At C4-5, there are right-sided facet hypertrophic changes contributing to moderate-to-severe right neural foraminal stenosis. At C5-6, there is disc space narrowing and endplate ridging with mild central spinal canal stenosis and mild-to-moderate right neural foraminal stenosis. At C6-7 and C7-T1, the spinal canal and neural foramina are widely patent. SOFT TISSUES: No prevertebral soft tissue swelling. IMPRESSION: 1. No acute fracture or traumatic malalignment. 2. Degenerative anterolisthesis at C4-5, bulging disc osteophyte complex at C3-4 causing mild-to-moderate central spinal canal stenosis, and right-sided facet hypertrophic changes at C4-5 contributing to moderate-to-severe right neural foraminal stenosis. Electronically signed by: evalene coho 10/18/2023 11:55 AM EDT RP Workstation: [id number]   CT Head Wo Contrast Result Date: 10/18/2023 EXAM: CT HEAD WITHOUT CONTRAST 10/18/2023 11:44:00 AM TECHNIQUE: CT of the head was performed without the administration of intravenous contrast. Automated exposure  control, iterative reconstruction, and/or weight based adjustment of the mA/kV was utilized to reduce the radiation dose to as low as reasonably achievable. COMPARISON: CT of the head dated 11/28/2020. CLINICAL HISTORY: Head trauma, minor (Age >= 65y). Triage note: Patient fell yesterday at 1600 and now has right shoulder pain, back pain, and collar bone pain. -LOC, and not anticoagulated. Ecchymosis noted to right clavicle and shoulder. FINDINGS: BRAIN AND VENTRICLES: No acute hemorrhage. Gray-white differentiation is preserved. No hydrocephalus. No extra-axial collection. No mass effect or midline shift. Age-related atrophy. Mild cerebral white matter disease. ORBITS: No acute abnormality. SINUSES: No acute abnormality. SOFT TISSUES AND SKULL: No acute soft tissue abnormality. No skull fracture. IMPRESSION: 1. No acute intracranial abnormality. 2. Age-related atrophy and mild cerebral white matter disease. Electronically signed by: evalene coho 10/18/2023 11:51 AM EDT RP Workstation: [id number]     Procedures   Medications Ordered in the ED  morphine  (PF) 4 MG/ML injection 4 mg (has no administration in time range)  HYDROcodone -acetaminophen  (NORCO/VICODIN) 5-325 MG per tablet 1 tablet (  1 tablet Oral Given 10/18/23 1114)    Clinical Course as of 10/18/23 1401  Tue Oct 18, 2023  1256 WBC(!): 11.9 [CA]  1256 Prothrombin Time: 13.4 [CA]  1256 INR: 1.0 [CA]    Clinical Course User Index [CA] Lorelle Aleck BROCKS, PA-C                                 Medical Decision Making Amount and/or Complexity of Data Reviewed Independent Historian: spouse    Details: Wife at bedside provided some history Labs: ordered. Decision-making details documented in ED Course. Radiology: ordered and independent interpretation performed. Decision-making details documented in ED Course.  Risk Prescription drug management. Decision regarding hospitalization.   This patient presents to the ED for concern of  fall, this involves an extensive number of treatment options, and is a complaint that carries with it a high risk of complications and morbidity.  The differential diagnosis includes bony fracture, dislocation, intracranial bleed, muscular strain, etc  73 year old male presents to the ED after a mechanical fall that occurred around 4 PM yesterday.  Patient lost his balance and fell backwards landing on his right shoulder.  Admits to right shoulder, clavicle, and anterior chest wall pain.  Patient notes he did hit his head.  No LOC.  Not on any blood thinners.  Upon arrival, stable vitals.  Patient in no acute distress.  Ecchymosis to right anterior chest wall around clavicle and upper ribs.  Right upper extremity neurovascularly intact with soft compartments.  Low suspicion for compartment syndrome.  Normal neurological exam.  CT head and cervical spine ordered.  X-rays ordered rule out bony fractures.  Hydrocodone  given for pain management.  X-rays personally reviewed and interpreted demonstrates a slightly displaced fracture of the 3rd and 4th ribs.  Also a fracture of the lateral third right clavicle. No significant skin tenting. No pneumothorax or pleural effusion.  CT head and cervical spine negative for any acute abnormalities.  Added labs given significant fractures.  CBC significant for leukocytosis 11.9.  Normal hemoglobin.  CMP reassuring.  Normal renal function.  No major electrolyte derangements.  Mild elevation in AST at 46.  Upon reassessment, patient admits to severe pain even after hydrocodone . Notes he is on chronic hydrocodone  and it relieve his pain. Patient will require admission for pain management secondary to his bony fractures.   Discussed with Dr. Randol who agrees with assessment and plan.   2:22 PM Discussed with Dr. Claudene with [facility] who agrees to admit patient  Co morbidities that complicate the patient evaluation  HTN   Social Determinants of Health:  Alcohol  abuse  Test / Admission - Considered:  Patient will require admission for pain management    Final diagnoses:  Closed fracture of multiple ribs of right side, initial encounter  Closed displaced fracture of acromial end of right clavicle, initial encounter    ED Discharge Orders     None          Lorelle Aleck BROCKS DEVONNA 10/18/23 1424    Randol Simmonds, MD 10/19/23 [phone]

## 2023-10-18 NOTE — Plan of Care (Signed)
   Problem: Education: Goal: Knowledge of General Education information will improve Description: Including pain rating scale, medication(s)/side effects and non-pharmacologic comfort measures Outcome: Progressing   Problem: Pain Managment: Goal: General experience of comfort will improve and/or be controlled Outcome: Progressing   Problem: Safety: Goal: Ability to remain free from injury will improve Outcome: Progressing

## 2023-10-18 NOTE — ED Notes (Signed)
 Howard Becker at [facility] for transport

## 2023-10-18 NOTE — ED Triage Notes (Signed)
 Patient fell yesterday at 1600 and now has right shoulder pain, back pain, and collar bone pain. -LOC, and not anticoagulated. Ecchymosis noted to right clavicle and shoulder.

## 2023-10-18 NOTE — H&P (Signed)
 History and Physical    Howard Becker FMW:[id number] DOB: 08/08/1950 DOA: 10/18/2023  PCP: [name], NP   Patient coming from: Home   Chief Complaint: Right shoulder and upper back pain after a fall   HPI: Howard Becker is a 73 y.o. male with medical history significant for cardiomyopathy, hypertension, and alcohol abuse who presents with pain in his right shoulder and upper back after a fall at home yesterday.  Patient reports that he was in his usual state when he had a trip and fall yesterday afternoon at home.  He hit his head but did not lose consciousness.  He denies any headache or focal numbness or weakness but has been experiencing pain in the right shoulder and upper back that is severe with movement of the right arm or deep breath.  [facility] ED Course: Upon arrival to the ED, patient is found to be afebrile and saturating well on room air with transient tachycardia and stable blood pressure.  Imaging reveals fracture of the lateral third of the right clavicle and slightly displaced fractures of the right lateral 3rd and 4th ribs.  Patient was treated with Norco and 2 doses of IV morphine  in the ED.  He was transferred to [facility] for ongoing management.  Review of Systems:  All other systems reviewed and apart from HPI, are negative.  Past Medical History:  Diagnosis Date   Abnormal serum level of lipase    Acute kidney injury (HCC)    Cardiomyopathy (HCC) 10/18/2023   Chest pain    Depression    ETOH abuse    Hyperlipidemia    Hypertension    Hyponatremia     Past Surgical History:  Procedure Laterality Date   BACK SURGERY     HERNIA REPAIR     LITHOTRIPSY      Social History:   reports that he has quit smoking. He has never used smokeless tobacco. He reports that he does not drink alcohol and does not use drugs.  Allergies  Allergen Reactions   Venlafaxine     Family History  Problem Relation Age of Onset   Hypertension  Mother    Cancer Father      Prior to Admission medications   Medication Sig Start Date End Date Taking? Authorizing Provider  Ascorbic Acid (VITAMIN C) 1000 MG tablet Take 2,000 mg by mouth daily.   Yes [provider]  b complex vitamins capsule Take 1 capsule by mouth daily.   Yes [provider]  clotrimazole -betamethasone  (LOTRISONE ) cream Apply 1 Application topically 2 (two) times daily. 12/29/21  Yes McDonald, Juliene SAUNDERS, DPM  HYDROcodone -acetaminophen  (NORCO) 10-325 MG tablet Take 1 tablet by mouth 2 (two) times daily as needed.   Yes [provider]  IRON, FERROUS SULFATE, PO Take 1 tablet by mouth daily.   Yes [provider]  ketoconazole (NIZORAL) 2 % cream Apply 1 Application topically daily. 07/28/23  Yes [provider]  Multiple Vitamin (MULITIVITAMIN WITH MINERALS) TABS Take 2 tablets by mouth every evening.    Yes [provider]  pregabalin  (LYRICA ) 300 MG capsule Take 300 mg by mouth 3 (three) times daily.   Yes [provider]  QUEtiapine  (SEROQUEL ) 25 MG tablet Take 25 mg by mouth 2 (two) times daily. 12/09/21  Yes [provider]  thiamine  100 MG tablet Take 1 tablet (100 mg total) by mouth daily. 04/26/17  Yes Tobie Yetta CHRISTELLA, MD  TURMERIC PO Take 1  tablet by mouth daily.   Yes [provider]    Physical Exam: Vitals:   10/18/23 1809 10/18/23 1904 10/18/23 2004 10/18/23 2022  BP:  (!) 152/81  (!) 142/70  Pulse:  72  (!) 54  Resp:  18    Temp: 97.7 F (36.5 C) 98.2 F (36.8 C)  98 F (36.7 C)  TempSrc: Oral Oral  Oral  SpO2:  98%  92%  Weight:   100 kg   Height:   6' 1 (1.854 m)     Constitutional: NAD, no pallor or diaphoresis   Eyes: PERTLA, lids and conjunctivae normal ENMT: Mucous membranes are moist. Posterior pharynx clear of any exudate or lesions.   Neck: supple, no masses  Respiratory: no wheezing, no crackles. No accessory muscle use.  Cardiovascular: S1 & S2 heard,  regular rate and rhythm. No extremity edema.  Abdomen: No tenderness, soft. Bowel sounds active.  Musculoskeletal: no clubbing / cyanosis. Right clavicle tenderness, neurovascularly intact.   Skin: no significant rashes, lesions, ulcers. Warm, dry, well-perfused. Neurologic: CN 2-12 grossly intact aside from hearing deficit. Moving all extremities. Alert and oriented.  Psychiatric: Calm. Cooperative.    Labs and Imaging on Admission: I have personally reviewed following labs and imaging studies  CBC: Recent Labs  Lab 10/18/23 1236  WBC 11.9*  NEUTROABS 9.4*  HGB 13.9  HCT 40.6  MCV 88.1  PLT 229   Basic Metabolic Panel: Recent Labs  Lab 10/18/23 1236  NA 137  K 4.2  CL 102  CO2 23  GLUCOSE 117*  BUN 19  CREATININE 1.19  CALCIUM 9.5   GFR: Estimated Creatinine Clearance: 68.7 mL/min (by C-G formula based on SCr of 1.19 mg/dL). Liver Function Tests: Recent Labs  Lab 10/18/23 1236  AST 46*  ALT 28  ALKPHOS 88  BILITOT 0.9  PROT 7.8  ALBUMIN 4.2   No results for input(s): LIPASE, AMYLASE in the last 168 hours. No results for input(s): AMMONIA in the last 168 hours. Coagulation Profile: Recent Labs  Lab 10/18/23 1236  INR 1.0   Cardiac Enzymes: No results for input(s): CKTOTAL, CKMB, CKMBINDEX, TROPONINI in the last 168 hours. BNP (last 3 results) No results for input(s): PROBNP in the last 8760 hours. HbA1C: No results for input(s): HGBA1C in the last 72 hours. CBG: No results for input(s): GLUCAP in the last 168 hours. Lipid Profile: No results for input(s): CHOL, HDL, LDLCALC, TRIG, CHOLHDL, LDLDIRECT in the last 72 hours. Thyroid Function Tests: No results for input(s): TSH, T4TOTAL, FREET4, T3FREE, THYROIDAB in the last 72 hours. Anemia Panel: No results for input(s): VITAMINB12, FOLATE, FERRITIN, TIBC, IRON, RETICCTPCT in the last 72 hours. Urine analysis:    Component Value Date/Time    COLORURINE YELLOW 01/02/2023 1057   APPEARANCEUR CLEAR 01/02/2023 1057   LABSPEC 1.009 01/02/2023 1057   PHURINE 5.5 01/02/2023 1057   GLUCOSEU NEGATIVE 01/02/2023 1057   HGBUR SMALL (A) 01/02/2023 1057   BILIRUBINUR NEGATIVE 01/02/2023 1057   KETONESUR NEGATIVE 01/02/2023 1057   PROTEINUR NEGATIVE 01/02/2023 1057   UROBILINOGEN 0.2 08/19/2011 1129   NITRITE NEGATIVE 01/02/2023 1057   LEUKOCYTESUR NEGATIVE 01/02/2023 1057   Sepsis Labs: @LABRCNTIP (procalcitonin:4,lacticidven:4) )No results found for this or any previous visit (from the past 240 hours).    Assessment/Plan   1. Right clavicle fracture  - Fracture noted on x-ray involving lateral 3rd of right clavicle with normal alignment of AC joint  - Place in shoulder sling, continue pain-control   2. Rib  fractures  - Right lateral 3rd and 4th rib fractures noted on x-ray without complication  - Continue pain-control, IS    3. Cardiomyopathy  - EF was 40-45% with grade 1 diastolic dysfunction on echo frm 2019  - He saw cardiology in June 2025 and was scheduled for repeat echo but has refused   - Appears compensated    4. Mood disorder, hx of alcohol abuse, neuropathy   - Denies any recent alcohol use   - Continue Seroquel  and Lyrica     DVT prophylaxis: Lovenox   Code Status: Full  Level of Care: Level of care: Telemetry Medical Family Communication: None present  Disposition Plan:  Patient is from: Home  Anticipated d/c is to: Home  Anticipated d/c date is: 8/13 or 10/20/23  Patient currently: Pending pain-control  Consults called: None  Admission status: Observation     Evalene GORMAN Sprinkles, MD [facility]  10/18/2023, 9:02 PM

## 2023-10-18 NOTE — Progress Notes (Signed)
 Orthopedic Tech Progress Note Patient Details:  Howard Becker 1950-06-17 [id number]  Patient ID: Howard Becker, male   DOB: [birth year], 73 y.o.   MRN: [id number] RN called requesting new sling stating previous sling was no where in room, upon entering room sling noted to be laying on computer desk area. Requested that the secretary notify RN that the sling was already in the patients room and had not been lost. Howard Becker 10/18/2023, 10:02 PM

## 2023-10-18 NOTE — Plan of Care (Signed)
 Transfer from [facility] Mr. Scalise is a 73 year old male with pmh HTN, HLD, depression who presents after having a fall yesterday.  Not on anticoagulations ecchymosis noted of the right clavicle and shoulder.  Labs noted WBC 11.9.  X-rays significant for right clavicle fracture with right lateral 3rd and 4th rib fractures.  Patient has been placed in a shoulder immobilizer and given pain medication.  Accepted to the medical telemetry bed for pain control.

## 2023-10-19 ENCOUNTER — Other Ambulatory Visit (HOSPITAL_COMMUNITY): Payer: Self-pay

## 2023-10-19 DIAGNOSIS — S42034A Nondisplaced fracture of lateral end of right clavicle, initial encounter for closed fracture: Secondary | ICD-10-CM | POA: Diagnosis not present

## 2023-10-19 LAB — CBC
HCT: 40.6 % (ref 39.0–52.0)
Hemoglobin: 14 g/dL (ref 13.0–17.0)
MCH: 30.2 pg (ref 26.0–34.0)
MCHC: 34.5 g/dL (ref 30.0–36.0)
MCV: 87.7 fL (ref 80.0–100.0)
Platelets: 222 K/uL (ref 150–400)
RBC: 4.63 MIL/uL (ref 4.22–5.81)
RDW: 15.1 % (ref 11.5–15.5)
WBC: 9.3 K/uL (ref 4.0–10.5)
nRBC: 0 % (ref 0.0–0.2)

## 2023-10-19 LAB — BASIC METABOLIC PANEL WITH GFR
Anion gap: 11 (ref 5–15)
BUN: 16 mg/dL (ref 8–23)
CO2: 23 mmol/L (ref 22–32)
Calcium: 8.8 mg/dL — ABNORMAL LOW (ref 8.9–10.3)
Chloride: 106 mmol/L (ref 98–111)
Creatinine, Ser: 1.07 mg/dL (ref 0.61–1.24)
GFR, Estimated: >60 mL/min (ref >60)
Glucose, Bld: 97 mg/dL (ref 70–99)
Potassium: 3.9 mmol/L (ref 3.5–5.1)
Sodium: 140 mmol/L (ref 135–145)

## 2023-10-19 MED ORDER — LIDOCAINE 5 % EX PTCH
1.0000 | MEDICATED_PATCH | CUTANEOUS | 0 refills | Status: AC
  Filled 2023-10-19: qty 10, 10d supply, fill #0

## 2023-10-19 MED ORDER — ACETAMINOPHEN 500 MG PO TABS
1000.0000 mg | ORAL_TABLET | Freq: Three times a day (TID) | ORAL | 2 refills | Status: AC
  Filled 2023-10-19: qty 100, 17d supply, fill #0

## 2023-10-19 MED ORDER — OXYCODONE HCL 5 MG PO TABS
5.0000 mg | ORAL_TABLET | Freq: Three times a day (TID) | ORAL | 0 refills | Status: AC | PRN
  Filled 2023-10-19: qty 20, 7d supply, fill #0

## 2023-10-19 NOTE — Progress Notes (Signed)
 Transition of Care [facility]) - CAGE-AID Screening   Patient Details  Name: Howard Becker MRN: [id number] Date of Birth: 1950-07-24   Darice CHRISTELLA Rouleau, RN Trauma Response Nurse Phone Number: [phone] 10/19/2023, 11:50 AM    CAGE-AID Screening:    Have You Ever Felt You Ought to Cut Down on Your Drinking or Drug Use?: No Have People Annoyed You By Critizing Your Drinking Or Drug Use?: No Have You Felt Bad Or Guilty About Your Drinking Or Drug Use?: No Have You Ever Had a Drink or Used Drugs First Thing In The Morning to Steady Your Nerves or to Get Rid of a Hangover?: No CAGE-AID Score: 0  Substance Abuse Education Offered: (S) No (Pt staes that he does not drink any alcohol, does not smoke or use any drugs - only takes prescription medications)

## 2023-10-19 NOTE — Discharge Summary (Signed)
 Physician Discharge Summary  Howard Becker FMW:[id number] DOB: 11-Sep-1950 DOA: 10/18/2023  PCP: [name], NP  Admit date: 10/18/2023 Discharge date: 10/19/2023  Time spent: 33 minutes  Recommendations for Outpatient Follow-up:  Recommend close follow-up with outpatient Ortho Dr. [name] for repeat films of right clavicle-CC him Needs Chem-7 CBC in the outpatient Limited prescription of Oxy and lidocaine  given first choice Tylenol   Discharge Diagnoses:  MAIN problem for hospitalization   Right clavicular fracture  Please see below for itemized issues addressed in HOpsital- refer to other progress notes for clarity if needed  Discharge Condition: Improved  Diet recommendation: Heart healthy  Filed Weights   10/18/23 2004  Weight: 100 kg    History of present illness:  73 year old white male known cardiomyopathy HTN prior EtOH had a 7 trip and fall on 8/12 no LOC experience pain immediately in right shoulder upper back severe with movement He was found to have fracture of lateral third of right clavicle and slightly displaced fractures of right lateral 3rd and 4th ribs He was given Norco and morphine  2 doses and because of ongoing pain was [facility] for adequate pain control His labs are arrival were negative for anything suggestive of metabolic abnormalities calcium was slightly low but I expect that was lab artifact and his white count was a little bit elevated from stress of the fracture I saw him on 8/13 he was ambulating well had taken some Norco I encouraged him to take Tylenol  I recommended that he put a lidocaine  patch on his right clavicle and he will take oxycodone  sparingly as per instructions He is stable for follow-up and should follow-up with Dr. [name] of orthopedics in the outpatient setting to ensure good callus and fracture healing   Discharge Exam: Vitals:   10/19/23 0729 10/19/23 1104  BP: 135/80  132/80  Pulse: 67 66  Resp: 17   Temp: 98.5 F (36.9 C) 97.7 F (36.5 C)  SpO2: 94% 93%    Subj on day of d/c    I want to go home States that pain is controlled took oxy this morning some pain on moving but still feels he can manage at home  General Exam on discharge  EOMI NCAT no focal deficit no icterus no pallor CTAB no added sound Chest is clear S1-S2 no murmur Moving left arm well-some discomfort difficulty with movement of right shoulder however good grip strength and extension of fingers Rest deferred  Discharge Instructions   Discharge Instructions     Diet - low sodium heart healthy   Complete by: As directed    Discharge instructions   Complete by: As directed    Place lidocaine  patch over your right clavicle every day, take Tylenol  1000 mg every 8 hours scheduled   I will prescribe a small amount of pain meds for you at discharge with Oxy IR 5 mg as needed as per instructions make sure you take a laxative or eat prunes/drink prune juice to help prevent constipation We will recommend that you get follow-up imaging at either your primary care office in about 2 weeks to make sure that the fracture is healing and or you may follow-up with an orthopedic of your choice I will leave the name of the on-call orthopedic doctor for you to make the appointment for the follow-up if you so desire Continue your other medications without change  For severe pain inability to move the arm etc. please  come back to the emergency room   Increase activity slowly   Complete by: As directed       Allergies as of 10/19/2023       Reactions   Venlafaxine         Medication List     STOP taking these medications    clotrimazole -betamethasone  cream Commonly known as: Lotrisone    IRON (FERROUS SULFATE) PO   ketoconazole 2 % cream Commonly known as: NIZORAL       TAKE these medications    acetaminophen  500 MG tablet Commonly known as: TYLENOL  Take 2 tablets  (1,000 mg total) by mouth every 8 (eight) hours.   b complex vitamins capsule Take 1 capsule by mouth daily.   HYDROcodone -acetaminophen  10-325 MG tablet Commonly known as: NORCO Take 1 tablet by mouth 2 (two) times daily as needed.   lidocaine  5 % Commonly known as: Lidoderm  Place 1 patch onto the skin daily for 10 days. Remove & Discard patch within 12 hours or as directed by MD   multivitamin with minerals Tabs tablet Take 2 tablets by mouth every evening.   oxyCODONE  5 MG immediate release tablet Commonly known as: Roxicodone  Take 1 tablet (5 mg total) by mouth every 8 (eight) hours as needed.   pregabalin  300 MG capsule Commonly known as: LYRICA  Take 300 mg by mouth 3 (three) times daily.   QUEtiapine  25 MG tablet Commonly known as: SEROQUEL  Take 25 mg by mouth 2 (two) times daily.   thiamine  100 MG tablet Commonly known as: VITAMIN B1 Take 1 tablet (100 mg total) by mouth daily.   TURMERIC PO Take 1 tablet by mouth daily.   vitamin C 1000 MG tablet Take 2,000 mg by mouth daily.       Allergies  Allergen Reactions   Venlafaxine     Follow-up Information     [facility], Geni LABOR, NP Follow up.   Specialty: Family Medicine Contact information: [address] [place] [address] [phone]         Georgina Ozell LABOR, MD Follow up.   Specialty: Orthopedic Surgery Contact information: [address] [place] [address] [phone]                  The results of significant diagnostics from this hospitalization (including imaging, microbiology, ancillary and laboratory) are listed below for reference.    Significant Diagnostic Studies: DG Ribs Unilateral W/Chest Right Result Date: 10/18/2023 EXAM: 3 VIEW(S) XRAY OF THE RIGHT RIBS AND CHEST 10/18/2023 11:41:00 AM COMPARISON: None available. CLINICAL HISTORY: Injury. Per triage notes: Patient fell yesterday at 1600 and now has right shoulder pain, back pain, and collar bone pain. -LOC, and  not anticoagulated. Ecchymosis noted to right clavicle and shoulder. FINDINGS: BONES: There are slightly displaced fractures of the right lateral third and fourth ribs. The ribs otherwise appear intact. There is an obliquely oriented fracture of the lateral third of the right clavicle. LUNGS AND PLEURA: No consolidation or pulmonary edema. No pleural effusion or pneumothorax. HEART AND MEDIASTINUM: No acute abnormality of the cardiac and mediastinal silhouettes. IMPRESSION: 1. Slightly displaced fractures of the right lateral third and fourth ribs. 2. Obliquely oriented fracture of the lateral third of the right clavicle. 3. No pleural effusion or pneumothorax. Electronically signed by: evalene coho 10/18/2023 12:07 PM EDT RP Workstation: [id number]   DG Clavicle Right Result Date: 10/18/2023 EXAM: 3 VIEW(S) XRAY OF THE RIGHT CLAVICLE 10/18/2023 11:41:00 AM COMPARISON: None available. CLINICAL HISTORY: Patient fell yesterday  at 1600 and now has right shoulder pain, back pain, and collar bone pain. -LOC, and not anticoagulated. Ecchymosis noted to right clavicle and shoulder. FINDINGS: BONES: There is an obliquely oriented fracture of the lateral third of the right clavicle. There are slightly displaced fractures of the right lateral third and fourth ribs also present. JOINTS: There is normal alignment of the right acromioclavicular joint. The right glenohumeral joint is unremarkable. SOFT TISSUES: The soft tissues are unremarkable. PLEURAL SPACES: There is no evidence of pneumothorax. IMPRESSION: 1. Obliquely oriented fracture of the lateral third of the right clavicle. 2. Slightly displaced fractures of the right lateral third and fourth ribs. 3. No pneumothorax. Electronically signed by: evalene coho 10/18/2023 12:05 PM EDT RP Workstation: [id number]   DG Shoulder Right Addendum Date: 10/18/2023 There is an obliquely oriented fracture of the lateral third of the right clavicle.  ---------------------------------------------------- Electronically signed by: timothy berrigan 10/18/2023 11:58 AM EDT RP Workstation: [id number]   Result Date: 10/18/2023 REPORT  EXAM: 1 VIEW XRAY OF THE RIGHT SHOULDER 10/18/2023 11:41:00 AM COMPARISON: None available. CLINICAL HISTORY: Injury. Per triage notes: Patient fell yesterday at 1600 and now has right shoulder pain, back pain, and collar bone pain. -LOC, and not anticoagulated. Ecchymosis noted to right clavicle and shoulder. FINDINGS: BONES AND JOINTS: Glenohumeral joint is normally aligned. No acute fracture or dislocation. The [facility] joint is unremarkable in appearance. SOFT TISSUES: No abnormal calcifications. Visualized lung is unremarkable. IMPRESSION: 1. No significant abnormality. Electronically signed by: evalene coho 10/18/2023 11:55 AM EDT RP Workstation: [id number]   CT Cervical Spine Wo Contrast Result Date: 10/18/2023 EXAM: CT CERVICAL SPINE WITHOUT CONTRAST 10/18/2023 11:44:00 AM TECHNIQUE: CT of the cervical spine was performed without the administration of intravenous contrast. Multiplanar reformatted images are provided for review. Automated exposure control, iterative reconstruction, and/or weight based adjustment of the mA/kV was utilized to reduce the radiation dose to as low as reasonably achievable. COMPARISON: None available. CLINICAL HISTORY: Neck trauma (Age >= 65y). Triage note: Patient fell yesterday at 1600 and now has right shoulder pain, back pain, and collar bone pain. -LOC, and not anticoagulated. Ecchymosis noted to right clavicle and shoulder. FINDINGS: CERVICAL SPINE: BONES AND ALIGNMENT: No acute fracture or traumatic malalignment. DEGENERATIVE CHANGES: Degenerative anterolisthesis present at C4-5, by approximately 3 mm. Bulging disc osteophyte complex present at C3-4, causing mild-to-moderate central spinal canal stenosis. At C4-5, there are right-sided facet hypertrophic changes contributing to  moderate-to-severe right neural foraminal stenosis. At C5-6, there is disc space narrowing and endplate ridging with mild central spinal canal stenosis and mild-to-moderate right neural foraminal stenosis. At C6-7 and C7-T1, the spinal canal and neural foramina are widely patent. SOFT TISSUES: No prevertebral soft tissue swelling. IMPRESSION: 1. No acute fracture or traumatic malalignment. 2. Degenerative anterolisthesis at C4-5, bulging disc osteophyte complex at C3-4 causing mild-to-moderate central spinal canal stenosis, and right-sided facet hypertrophic changes at C4-5 contributing to moderate-to-severe right neural foraminal stenosis. Electronically signed by: evalene coho 10/18/2023 11:55 AM EDT RP Workstation: [id number]   CT Head Wo Contrast Result Date: 10/18/2023 EXAM: CT HEAD WITHOUT CONTRAST 10/18/2023 11:44:00 AM TECHNIQUE: CT of the head was performed without the administration of intravenous contrast. Automated exposure control, iterative reconstruction, and/or weight based adjustment of the mA/kV was utilized to reduce the radiation dose to as low as reasonably achievable. COMPARISON: CT of the head dated 11/28/2020. CLINICAL HISTORY: Head trauma, minor (Age >= 65y). Triage note: Patient fell yesterday at 1600 and now has right shoulder pain, back pain,  and collar bone pain. -LOC, and not anticoagulated. Ecchymosis noted to right clavicle and shoulder. FINDINGS: BRAIN AND VENTRICLES: No acute hemorrhage. Gray-white differentiation is preserved. No hydrocephalus. No extra-axial collection. No mass effect or midline shift. Age-related atrophy. Mild cerebral white matter disease. ORBITS: No acute abnormality. SINUSES: No acute abnormality. SOFT TISSUES AND SKULL: No acute soft tissue abnormality. No skull fracture. IMPRESSION: 1. No acute intracranial abnormality. 2. Age-related atrophy and mild cerebral white matter disease. Electronically signed by: evalene coho 10/18/2023 11:51 AM EDT RP  Workstation: [id number]    Microbiology: No results found for this or any previous visit (from the past 240 hours).   Labs: Basic Metabolic Panel: Recent Labs  Lab 10/18/23 1236 10/19/23 0332  NA 137 140  K 4.2 3.9  CL 102 106  CO2 23 23  GLUCOSE 117* 97  BUN 19 16  CREATININE 1.19 1.07  CALCIUM 9.5 8.8*   Liver Function Tests: Recent Labs  Lab 10/18/23 1236  AST 46*  ALT 28  ALKPHOS 88  BILITOT 0.9  PROT 7.8  ALBUMIN 4.2   No results for input(s): LIPASE, AMYLASE in the last 168 hours. No results for input(s): AMMONIA in the last 168 hours. CBC: Recent Labs  Lab 10/18/23 1236 10/19/23 0332  WBC 11.9* 9.3  NEUTROABS 9.4*  --   HGB 13.9 14.0  HCT 40.6 40.6  MCV 88.1 87.7  PLT 229 222   Cardiac Enzymes: No results for input(s): CKTOTAL, CKMB, CKMBINDEX, TROPONINI in the last 168 hours. BNP: BNP (last 3 results) No results for input(s): BNP in the last 8760 hours.  ProBNP (last 3 results) No results for input(s): PROBNP in the last 8760 hours.  CBG: No results for input(s): GLUCAP in the last 168 hours.  Signed:  Jai-Gurmukh Karandeep Resende MD   [facility] 10/19/2023, 12:10 PM

## 2023-10-19 NOTE — Care Management Obs Status (Signed)
 MEDICARE OBSERVATION STATUS NOTIFICATION   Patient Details  Name: Howard Becker MRN: [id number] Date of Birth: Jul 13, 1950   Medicare Observation Status Notification Given:  Yes    Jon Cruel 10/19/2023, 12:30 PM

## 2023-10-19 NOTE — Progress Notes (Signed)
 Reviewed AVS, patient expressed understanding of medications, MD follow up reviewed.   Removed IV, Site clean, dry and intact.  See LDA for information on wounds at discharge. CCMD contacted and informed patients is being discharged.  Patient states all belongings brought to the hospital at time of admission are accounted for and packed to take home.  Picked up medications from [facility] pharmacy. [facility] contacted to transport patient to ED Dept entrance where family member was waiting in vehicle to transport home.

## 2024-03-21 ENCOUNTER — Other Ambulatory Visit (HOSPITAL_COMMUNITY): Payer: Self-pay
